# Patient Record
Sex: Male | Born: 1975 | Race: Black or African American | Hispanic: No | Marital: Single | Smoking: Never smoker
Health system: Southern US, Community
[De-identification: ages and names within clinical notes are randomized; demographics above are authoritative.]

## PROBLEM LIST (undated history)

## (undated) DIAGNOSIS — M419 Scoliosis, unspecified: Secondary | ICD-10-CM

## (undated) DIAGNOSIS — I10 Essential (primary) hypertension: Secondary | ICD-10-CM

---

## 2007-05-27 ENCOUNTER — Emergency Department: Payer: Self-pay | Admitting: Internal Medicine

## 2008-05-14 ENCOUNTER — Emergency Department: Payer: Self-pay | Admitting: Emergency Medicine

## 2008-05-21 ENCOUNTER — Emergency Department: Payer: Self-pay | Admitting: Unknown Physician Specialty

## 2008-06-15 ENCOUNTER — Ambulatory Visit: Payer: Self-pay | Admitting: Urology

## 2009-09-21 IMAGING — CR DG CHEST 2V
1 series · 2 of 2 positions shown · non-contrast
Comparison: none

REASON FOR EXAM: Coughing up blood
COMMENTS:

PROCEDURE:     DXR - DXR CHEST PA (OR AP) AND LATERAL  - May 21, 2008 [DATE]
RESULT:     There is a pronounced rotoscoliosis, concave to the left,
centered at approximately the T5 or T6 level.
The lungs appear to be clear. The heart and pulmonary vessels are normal.

[Series 1: view not recorded · 0.17mm/px · 2 of 2 slices shown]
[im 1/2]
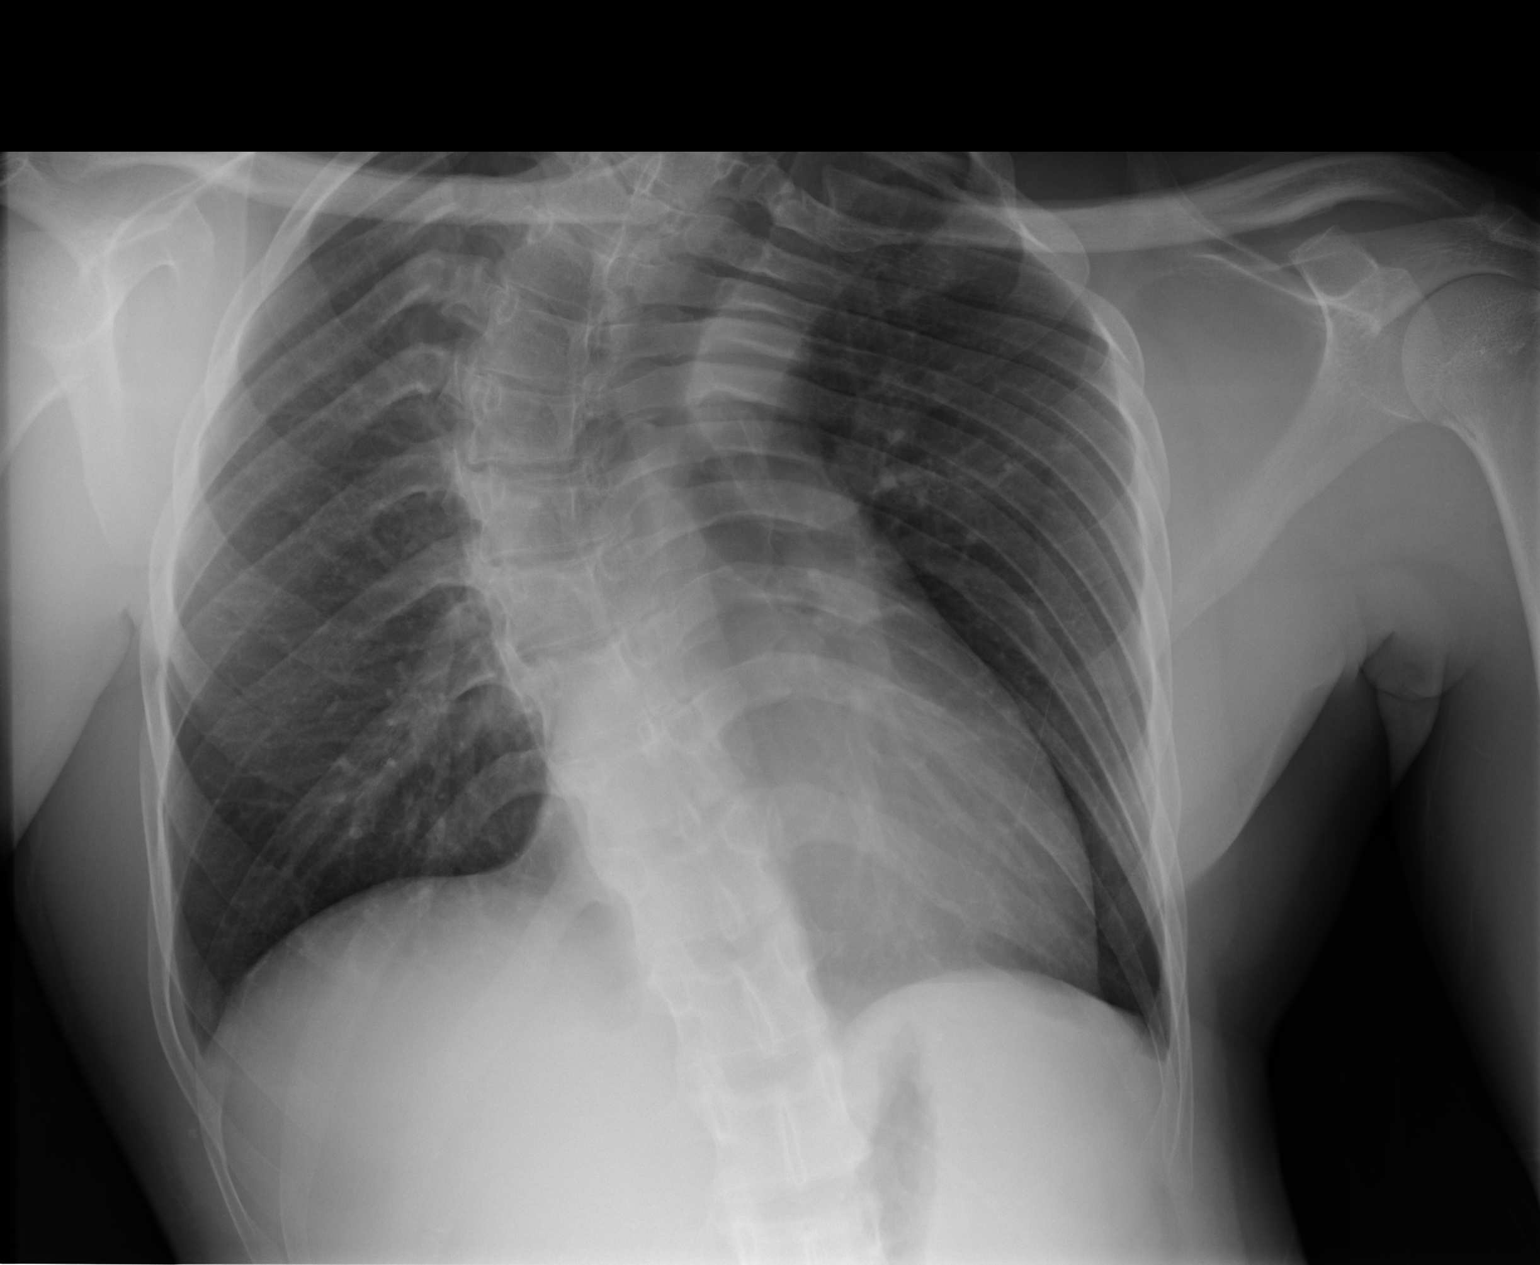
[im 2/2]
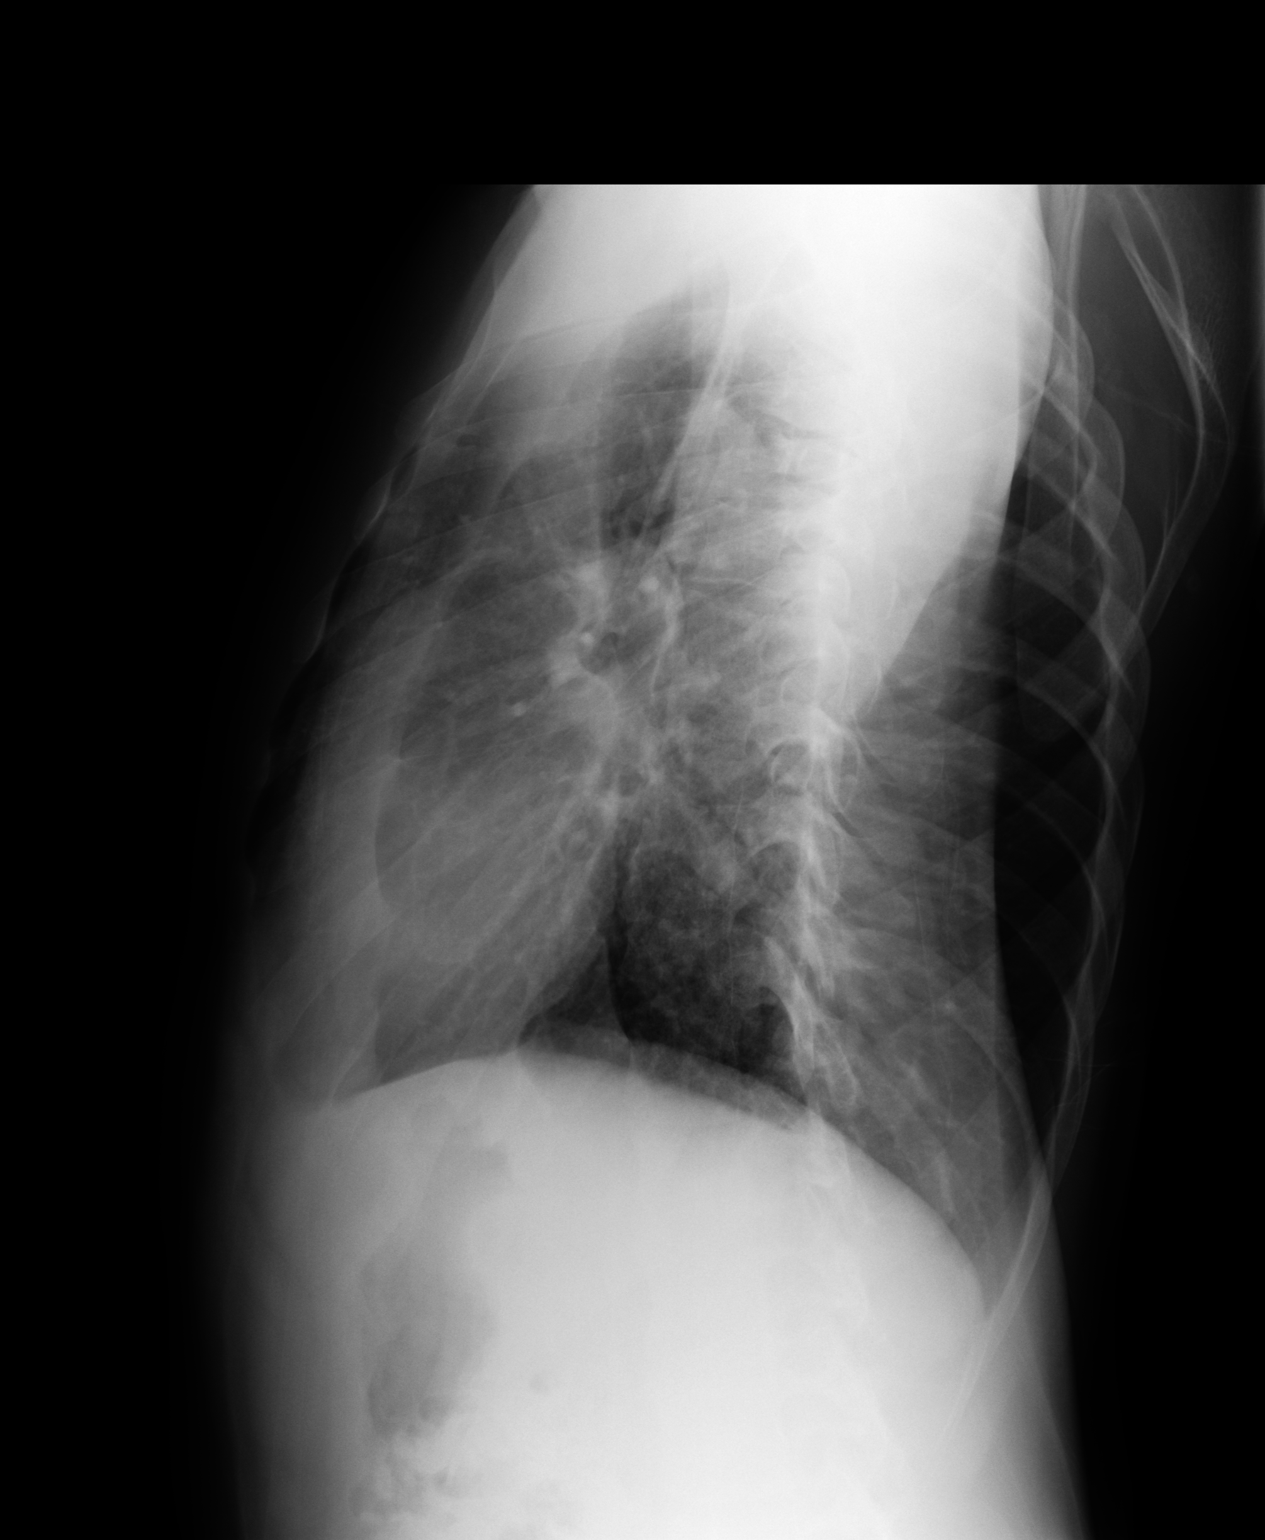

[2 of 2 positions shown; findings below may reference images not displayed]

IMPRESSION: 1.     No acute cardiopulmonary disease.
2.     Pronounced levoscoliosis.

## 2015-02-15 ENCOUNTER — Emergency Department
Admission: EM | Admit: 2015-02-15 | Discharge: 2015-02-15 | Disposition: A | Discharge status: Home / Self Care | Payer: BLUE CROSS/BLUE SHIELD | Attending: Emergency Medicine | Admitting: Emergency Medicine

## 2015-02-15 ENCOUNTER — Encounter: Payer: Self-pay | Admitting: Emergency Medicine

## 2015-02-15 ENCOUNTER — Emergency Department: Payer: BLUE CROSS/BLUE SHIELD

## 2015-02-15 DIAGNOSIS — Y998 Other external cause status: Secondary | ICD-10-CM | POA: Insufficient documentation

## 2015-02-15 DIAGNOSIS — R339 Retention of urine, unspecified: Secondary | ICD-10-CM | POA: Insufficient documentation

## 2015-02-15 DIAGNOSIS — S46912A Strain of unspecified muscle, fascia and tendon at shoulder and upper arm level, left arm, initial encounter: Secondary | ICD-10-CM | POA: Diagnosis not present

## 2015-02-15 DIAGNOSIS — Z8709 Personal history of other diseases of the respiratory system: Secondary | ICD-10-CM | POA: Diagnosis not present

## 2015-02-15 DIAGNOSIS — X58XXXA Exposure to other specified factors, initial encounter: Secondary | ICD-10-CM | POA: Diagnosis not present

## 2015-02-15 DIAGNOSIS — Y9289 Other specified places as the place of occurrence of the external cause: Secondary | ICD-10-CM | POA: Insufficient documentation

## 2015-02-15 DIAGNOSIS — Z79899 Other long term (current) drug therapy: Secondary | ICD-10-CM | POA: Insufficient documentation

## 2015-02-15 DIAGNOSIS — G8929 Other chronic pain: Secondary | ICD-10-CM | POA: Insufficient documentation

## 2015-02-15 DIAGNOSIS — Y9389 Activity, other specified: Secondary | ICD-10-CM | POA: Insufficient documentation

## 2015-02-15 DIAGNOSIS — S4992XA Unspecified injury of left shoulder and upper arm, initial encounter: Secondary | ICD-10-CM | POA: Diagnosis present

## 2015-02-15 DIAGNOSIS — I10 Essential (primary) hypertension: Secondary | ICD-10-CM | POA: Diagnosis not present

## 2015-02-15 DIAGNOSIS — R52 Pain, unspecified: Secondary | ICD-10-CM

## 2015-02-15 HISTORY — DX: Essential (primary) hypertension: I10

## 2015-02-15 HISTORY — DX: Scoliosis, unspecified: M41.9

## 2015-02-15 MED ORDER — CYCLOBENZAPRINE HCL 10 MG PO TABS: 10.0000 mg | ORAL_TABLET | Freq: Three times a day (TID) | ORAL | Status: AC | PRN

## 2015-02-15 MED ORDER — IBUPROFEN 800 MG PO TABS
800.0000 mg | ORAL_TABLET | Freq: Once | ORAL | Status: AC
  Administered 2015-02-15: 800 mg via ORAL
  Filled 2015-02-15: qty 1

## 2015-02-15 MED ORDER — IBUPROFEN 800 MG PO TABS: 800.0000 mg | ORAL_TABLET | Freq: Three times a day (TID) | ORAL | Status: DC | PRN

## 2015-02-15 MED ORDER — TRAMADOL HCL 50 MG PO TABS
50.0000 mg | ORAL_TABLET | Freq: Once | ORAL | Status: AC
  Administered 2015-02-15: 50 mg via ORAL
  Filled 2015-02-15: qty 1

## 2015-02-15 NOTE — ED Notes (Signed)
Pt to ED with chronic left shoulder pain, states he has had the pain since he was 39 years old but it has been getting worse

## 2015-02-15 NOTE — ED Notes (Signed)
Developed left shoulder pain after exercising yesterday   Denies any fall or trauma

## 2015-02-15 NOTE — ED Provider Notes (Signed)
[facility] Emergency Department Provider Note  ____________________________________________  Time seen: Approximately 4:38 PM  I have reviewed the triage vital signs and the nursing notes.   HISTORY  Chief Complaint Shoulder Pain    HPI Joshua [name] is a 39 y.o. male is complaining of left shoulder pain status post exercising yesterday. Same because he has chronic left shoulder pain since age28 years old and diagnosed with scoliosis.Patient is rating his pain as a 10 over 10. Patient states this pain is not relieve over-the-counter anti-inflammatory medication which normally helps.   Past Medical History  Diagnosis Date  . Scoliosis   . Hypertension     There are no active problems to display for this patient.   History reviewed. No pertinent past surgical history.  Current Outpatient Rx  Name  Route  Sig  Dispense  Refill  . metoprolol (LOPRESSOR) 50 MG tablet   Oral   Take 50 mg by mouth 2 (two) times daily.         . metoprolol (LOPRESSOR) 50 MG tablet   Oral   Take 50 mg by mouth daily.         . cyclobenzaprine (FLEXERIL) 10 MG tablet   Oral   Take 1 tablet (10 mg total) by mouth every 8 (eight) hours as needed for muscle spasms.   15 tablet   0   . ibuprofen (ADVIL,MOTRIN) 800 MG tablet   Oral   Take 1 tablet (800 mg total) by mouth every 8 (eight) hours as needed for moderate pain.   15 tablet   0     Allergies Review of patient's allergies indicates no known allergies.  No family history on file.  Social History Social History  Substance Use Topics  . Smoking status: Never Smoker   . Smokeless tobacco: None  . Alcohol Use: Yes    Review of Systems onstitutional: No fever/chills Eyes: No visual changes. ENT: No sore throat. Cardiovascular: Denies chest pain. Respiratory: Denies shortness of breath. Gastrointestinal: No abdominal pain.  No nausea, no vomiting.  No diarrhea.  No constipation. Genitourinary:  Negative for dysuria. Musculoskeletal: Scapular area (left) Skin: Negative for rash. Neurological: Negative for headaches, focal weakness or numbness. Endocrine: retention 10-point ROS otherwise negative.  ____________________________________________   PHYSICAL EXAM:  VITAL SIGNS: ED Triage Vitals  Enc Vitals Group     BP 02/15/15 1604 179/91 mmHg     Pulse Rate 02/15/15 1604 78     Resp --      Temp 02/15/15 1604 97.7 F (36.5 C)     Temp Source 02/15/15 1604 Oral     SpO2 02/15/15 1604 94 %     Weight 02/15/15 1604 145 lb (65.772 kg)     Height 02/15/15 1604  (1.753 m)     Head Cir --      Peak Flow --      Pain Score 02/15/15 1604 10     Pain Loc --      Pain Edu? --      Excl. in GC? --     Constitutional: Alert and oriented. Well appearing and in no acute distress. Eyes: Conjunctivae are normal. PERRL. EOMI. Head: Atraumatic. Nose: No congestion/rhinnorhea. Mouth/Throat: Mucous membranes are moist.  Oropharynx non-erythematous. Neck: No stridor.   Hematological/Lymphatic/Immunilogical: No cervical lymphadenopathy. Cardiovascular: Normal rate, regular rhythm. Grossly normal heart sounds.  Good peripheral circulation.elevate BP. Respiratory: Normal respiratory effort.  No retractions. Lungs CTAB. Gastrointestinal: Soft and nontender. No distention. No  abdominal bruits. No CVA tenderness. MusculoskeletalThoracic spinal deformities consistent history scoliosis. Winging of the left scapular area. urologic:  Normal speech and language. No gross focal neurologic deficits are appreciated. No gait instability. Skin:  Skin is warm, dry and intact. No rash noted. Psychiatric: Mood and affect are normal. Speech and behavior are normal.  ____________________________________________   LABS (all labs ordered are listed, but only abnormal results are displayed)  Labs Reviewed - No data to  display ____________________________________________  EKG   ____________________________________________  RADIOLOGY  No acute findings. I, Joni Reining, personally viewed and evaluated these images (plain radiographs) as part of my medical decision making.   ____________________________________________   PROCEDURES  Procedure(s) performed: None  Critical Care performed: No  ____________________________________________   INITIAL IMPRESSION / ASSESSMENT AND PLAN / ED COURSE  Pertinent labs & imaging results that were available during my care of the patient were reviewed by me and considered in my medical decision making (see chart for details).  Scapular muscle strain. Discussed x-ray findings with patients.____Patient advised to follow-up [facility]. Given a prescription for Flexeril and I for Profen. ________________________________________   FINAL CLINICAL IMPRESSION(S) / ED DIAGNOSES  Final diagnoses:  Pain aggravated by physical activity  Muscle strain of left scapular region, initial encounter      Joni Reining, PA-C 02/15/15 1721  Arnaldo Natal, MD 02/16/15 0010

## 2016-12-18 ENCOUNTER — Encounter: Payer: Self-pay | Admitting: Physical Therapy

## 2016-12-18 ENCOUNTER — Ambulatory Visit: Payer: BLUE CROSS/BLUE SHIELD | Attending: Physical Medicine and Rehabilitation | Admitting: Occupational Therapy

## 2016-12-18 ENCOUNTER — Ambulatory Visit: Payer: BLUE CROSS/BLUE SHIELD | Admitting: Physical Therapy

## 2016-12-18 ENCOUNTER — Encounter: Payer: Self-pay | Admitting: Occupational Therapy

## 2016-12-18 DIAGNOSIS — R278 Other lack of coordination: Secondary | ICD-10-CM | POA: Insufficient documentation

## 2016-12-18 DIAGNOSIS — M6281 Muscle weakness (generalized): Secondary | ICD-10-CM

## 2016-12-18 DIAGNOSIS — R2681 Unsteadiness on feet: Secondary | ICD-10-CM

## 2016-12-18 DIAGNOSIS — R262 Difficulty in walking, not elsewhere classified: Secondary | ICD-10-CM

## 2016-12-18 NOTE — Therapy (Addendum)
[facility] [address] [place], [place], [address] Phone: [phone]   Fax:  [phone]  Physical Therapy Evaluation  Patient Details  Name: Joshua HotterJames [name] MRN: [id number] Date of Birth: February 27, 1976 Referring Provider: Merrily BrittleAUCH, KIMBERLY [name]  Encounter Date: 12/18/2016      PT End of Session - 12/18/16 1450    Visit Number 1   Number of Visits 17   Date for PT Re-Evaluation 02/12/17   Authorization Type [employer]   PT Start Time 1430   PT Stop Time 1530   PT Time Calculation (min) 60 min   Equipment Utilized During Treatment Gait belt   Activity Tolerance Patient tolerated treatment well;Patient limited by pain   Behavior [facility] for tasks assessed/performed      Past Medical History:  Diagnosis Date  . Hypertension   . Scoliosis     History reviewed. No pertinent surgical history.  There were no vitals filed for this visit.       Subjective Assessment - 12/18/16 1434    Subjective Patient describes 5/10 pain at posterior of C6 with no radiating symptoms.  Patient denies headaches and dizziness. Patient claims sleeping throughout the whole night and is able to lie on side and on back.    Pertinent History Patient describes intermitten 3/10 pain in neck before scoliosis surgery. He reports that he was walking without  AD prior to surgery.  Patient had neck surgery in October of 2017, consisting of 13 screws in anterior neck, due to drastic change in balance and coordination.  Scoliosis surgery performed 11/27/16 with fusion of 13 vertebrae.  Patient denies dizziness, headaches, visual defecits, numbness, and tingling. Patient has 2 drains from abdomen.    Limitations Standing;Walking;Lifting   How long can you sit comfortably? 10 minutes   How long can you stand comfortably? 20 minutes   How long can you walk comfortably? 10 minutes   Patient Stated Goals Walk straight and be independent with ambulation    Currently in Pain? Yes   Pain Score 5    Pain Location Neck   Pain Orientation Posterior   Pain Descriptors / Indicators Sore   Pain Type Acute pain   Pain Radiating Towards N/A   Pain Onset More than a month ago   Pain Frequency Intermittent   Aggravating Factors  Standing   Pain Relieving Factors Ibuprofen   Effect of Pain on Daily Activities Unable to perform ADLs independently without difficulty   Multiple Pain Sites No            [facility] PT Assessment - 12/18/16 1443      Assessment   Medical Diagnosis spinal fusion   Referring [facility] Joshua [name]   Onset Date/Surgical Date 11/27/16   Hand Dominance Right   Prior Therapy Inpatient      Precautions   Precautions Back   Precaution Booklet Issued No   Precaution Comments no bending, lifting, twisting     Restrictions   Weight Bearing Restrictions No     Balance Screen   Has the patient fallen in the past 6 months Yes   How many times? once   Has the patient had a decrease in activity level because of a fear of falling?  Yes   Is the patient reluctant to leave their home because of a fear of falling?  Yes     Home Nurse, mental healthnvironment   Living Environment Private residence   Living Arrangements Parent  Available Help at Discharge Family;Friend([name])   Type of Home Mobile home   Home Access Stairs to enter   Entrance Stairs-Number of Steps 3   Entrance Stairs-Rails Can reach both   Home Layout One level   Home Equipment Walker - 2 wheels;Cane - single point     Prior Function   Level of Independence Independent   Vocation Full time employment   Vocation Requirements lifting, stocking, bending, being on feet during whole shift   Leisure write poems, basketball, video games     Cognition   Overall Cognitive Status Within Functional Limits for tasks assessed        PAIN: constant 3-5/10 intermittent 7/10. Increase in cervical pain with MMT of shoulder   POSTURE: Severe stiffness in upper thoracic and  cervical region of spine.   PROM/AROM: limited B shoulder abduction and flexion 120  degrees bilaterally  STRENGTH:  Graded on a 0-5 scale Muscle Group Left Right  Shoulder flex 3+/5 3+/5  Shoulder Abd 3+/5 3+/5  Shoulder Ext NT NT  Shoulder IR/ER NT NT  Elbow 5/5 5/5  Wrist/hand 5/5 5/5  Hip Flex 4/5 4/5  Hip Abd 4+/5 4+/5  Hip Add 4+/5 4+/5  Hip Ext NT NT  Hip IR/ER NT NT  Knee Flex 5/5 5/5  Knee Ext 5/5 5/5  Ankle DF 3/5 3/5  Ankle PF 4+/5 4+/5   SENSATION:  Intact bilaterally UE and LE   SPECIAL TESTS: negative SLR test   FUNCTIONAL MOBILITY: Independent with log roll. Able to pivot transfer with BUE assist.   BALANCE: Able to balance in narrow stance without walker for 1 minute EO; 5 seconds with EC.   GAIT:  2-wheel walker necessary for ambulation. Patient presents forward trunk lean, heavy assistance of BUE, narrow base of support, and poor control of ankle motion throughout gait cycle. Occasional shakiness in BLE.   OUTCOME MEASURES: TEST Outcome Interpretation      10 meter walk test          .47  m/[name] <1.0 m/[name] indicates increased risk for falls; limited community ambulator          [employer] Assessment 27/56 <36/56 (100% risk for falls), 37-45 (80% risk for falls); 46-51 (>50% risk for falls); 52-55 (lower risk <25% of falls)           Objective measurements completed on examination: See above findings.    Treatment/HEP:  Supine assisted HS stretch with strap 3 x 30 seconds Supine chin tucks with isometric shoulder extension/scap retractions 10 x 10 seconds.   Patient tolerated treatment session well today with no increase in pain.               PT Education - 12/18/16 1449    Education provided Yes   Education Details strengthen important upper thoracic/cervical postural muscles and stretch HS   Person([name]) Educated Patient;Parent([name])   Methods Explanation;Demonstration   Comprehension Verbalized understanding              PT Long Term Goals - 12/18/16 1615      PT LONG TERM GOAL #1   Title Increase BERG score by 7 points to show decreased falls risk with ADLs   Baseline 12/18/16 27/56   Time 8   Period Weeks   Status New     PT LONG TERM GOAL #2   Title Increase 10 MWT to 1.0 m/[name] to improve ability to safely ambulate in community   Baseline 12/18/16 .47 m/[name]  Time 8   Period Weeks     PT LONG TERM GOAL #3   Title Patient will be independent in home exercise program to improve strength/mobility for better functional independence with ADLs.   Baseline 01/05/17   Time 8   Period Weeks   Status New     PT LONG TERM GOAL #4   Title Patient will reduce timed up and go to <11 seconds to reduce fall risk and demonstrate improved transfer/gait ability.   Baseline 01/05/17 not tested due to safety   Time 8   Period Weeks   Status New                Plan - 2017-01-05 1556    Clinical Impression Statement Patient presents with referral for physical therapy from MD [name]/p cervical/thoracic spinal fusion surgery on 11/27/17.  Patient describes difficulty standing, walking, bending, lifting, and twisting due to pain in cervical and thoracic regions that is decreasing functional mobility and independence with ADLs.  Evaluation revealed impaired ankle and shoulder strength; ambulation with walker was guarded, slow  and CGA required for safety.  Patient was able to ambulate 200 feet today in clinic and out to car Patient will benefit from skilled physical therapy to improve functional mobility to increase independence with ADLs.   History and Personal Factors relevant to plan of care: This patient presents with 1 personal factors/ comorbidities; anxiety about injury and 4  body elements including body structures and functions, activity limitations and or participation restrictions; decrease ankle strength, poor balance, decreased coordination, and decreased endurance. Patient'[name] condition is stable   Clinical  Presentation Stable   Clinical Presentation due to: Surgery went well. Pain is already decreasing and patient is off of oxycodon.    Clinical Decision Making Moderate   Rehab Potential Good   Clinical Impairments Affecting Rehab Potential Poor balance, weakness in ankle joint, decreased endurance   PT Frequency 2x / week   PT Duration 8 weeks   PT Treatment/Interventions ADLs/Self Care Home Management;Aquatic Therapy;Biofeedback;Electrical Stimulation;Moist Heat;Traction;Contrast Bath;Gait training;Stair training;Functional mobility training;Therapeutic activities;Therapeutic exercise;Balance training;Neuromuscular re-education;Patient/family education;Manual techniques;Passive range of motion;Energy conservation;Splinting;Taping   PT Next Visit Plan Progress ROM exercises and begin stabilizing exercises   PT Home Exercise Plan HS stretch, chin tucks, and posterior upper quarter isometrics   Consulted and Agree with Plan of Care Patient      Patient will benefit from skilled therapeutic intervention in order to improve the following deficits and impairments:  Abnormal gait, Decreased coordination, Decreased range of motion, Difficulty walking, Decreased endurance, Decreased activity tolerance, Pain, Decreased balance, Hypomobility, Improper body mechanics, Decreased mobility, Decreased strength  Visit Diagnosis: Muscle weakness (generalized)  Difficulty in walking, not elsewhere classified  Unsteadiness on feet      G-Codes - 05-Jan-2017 1723    Functional Assessment Tool Used (Outpatient Only) Sharlene Motts ,10 MW   Functional Limitation Mobility: Walking and moving around   Mobility: Walking and Moving Around Current Status [phone]) At least 60 percent but less than 80 percent impaired, limited or restricted   Mobility: Walking and Moving Around Goal Status [phone]) At least 40 percent but less than 60 percent impaired, limited or restricted       Problem List There are no active problems to  display for this patient. This entire session was performed under direct supervision and direction of a licensed therapist/therapist assistant . I have personally read, edited and approve of the note as written. Joshua [name] SPT  Joshua [name], Joshua [name]  Joshua [name]  Joshua [name], PT, DPT 12/18/2016, 5:34 PM  [facility] MAIN [facility] SERVICES [address] [place], [place], [address] Phone: [phone]   Fax:  [phone]  Name: Joshua [name] MRN: [id number] Date of Birth: [birth year]

## 2016-12-20 NOTE — Therapy (Signed)
[place] [facility] MAIN [facility] SERVICES [address] [place], [place], [address] Phone: [phone]   Fax:  [phone]  Occupational Therapy Evaluation  Patient Details  Name: Joshua [name] MRN: [id number] Date of Birth: 04-Jun-1975 No Data Recorded  Encounter Date: 12/18/2016      OT End of Session - 12/20/16 2321    Visit Number 1   Number of Visits 24   Authorization Type BCBS   OT Start Time 1300   OT Stop Time 1355   OT Time Calculation (min) 55 min   Activity Tolerance Patient tolerated treatment well   Behavior During Therapy [facility] for tasks assessed/performed      Past Medical History:  Diagnosis Date  . Hypertension   . Scoliosis     History reviewed. No pertinent surgical history.  There were no vitals filed for this visit.      Subjective Assessment - 12/20/16 2320    Subjective  Patient reports he has scoliosis and just had recent surgery to his spine.  He reports he would like to get back to taking care of himself and possibly go back to work, likes to be around people.    Pertinent History Patient underwent spinal surgery on June 28 at [facility] and was admitted for 2.5 weeks including inpatient rehabilitation.  He did not receive home health and now presents for outpatient.     Patient Stated Goals Patient reports he would like to walk straighter, try to go back to work and also be able to take care of himself.    Currently in Pain? Yes   Pain Score 5    Pain Location Neck   Pain Orientation Posterior   Pain Descriptors / Indicators Sore   Pain Type Acute pain   Pain Onset More than a month ago   Pain Frequency Intermittent           [facility] OT Assessment - 12/20/16 2322      Assessment   Diagnosis spinal fusion, scoliosis   Onset Date 11/27/16     Precautions   Precautions Back   Precaution Booklet Issued No   Precaution Comments no bending, lifting, twisting     Restrictions   Weight Bearing Restrictions  No     Balance Screen   Has the patient fallen in the past 6 months No   Has the patient had a decrease in activity level because of a fear of falling?  No   Is the patient reluctant to leave their home because of a fear of falling?  No     Home  Environment   Family/patient expects to be discharged to: Private residence   Living Arrangements Parent   Available Help at Discharge Family   Type of Home Mobile home   Home Access Stairs   Home Layout One level   Alternate Level Stairs - Number of Steps 3-4 steps to enter with a handrails on both sides   Bathroom Shower/Tub Tub/Shower unit;Curtain   Shower/tub characteristics Curtain   Database administrator - 2 wheels;Bedside commode;Tub bench;Grab bars - tub/shower   Lives With Family     Prior Function   Level of Independence Independent with basic ADLs   Vocation Unemployed;Other (comment)  pursuing disability, 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     ADL   Eating/Feeding Modified independent   Grooming Modified independent   Upper Body Bathing Moderate assistance  Lower Body Bathing Moderate assistance   Upper Body Dressing Independent   Lower Body Dressing Minimal assistance   Toilet Transfer Modified independent   Toileting - Clothing Manipulation Increased time   Toileting -  Hygiene Modified Independent   Tub/Shower Transfer Supervision/safety   Equipment Used Reacher     IADL   Prior Level of Function Shopping independent   Shopping Completely unable to shop   Prior Level of Function Light Housekeeping independent    Light Housekeeping Does not participate in any housekeeping tasks;Needs help with all home maintenance tasks   Prior Level of Function Meal Prep mom always cooked, light meal prep with snack   Meal Prep Needs to have meals prepared and served   [employer] on family or friends for transportation   Medication Management Is responsible for taking  medication in correct dosages at correct time   Prior Level of Function Financial Management independent   Financial Management Requires assistance     Mobility   Mobility Status Needs assist   Mobility Status Comments used a walker around the home prior and a cane when going out. Now uses walker all the time.       Vision - History   Baseline Vision Wears glasses all the time     Cognition   Overall Cognitive Status Within Functional Limits for tasks assessed     Coordination   Fine Motor Movements are Fluid and Coordinated No   Coordination and Movement Description impaired on left   Finger Nose Finger Test impaired   9 Hole Peg Test Right;Left   Right 9 Hole Peg Test 43   Left 9 Hole Peg Test 59     ROM / Strength   AROM / PROM / Strength AROM;Strength     AROM   Overall AROM  Deficits   Overall AROM Comments Right shoulder flexion 115 degrees, left shoulder 110 degrees, all other motions WFLs     Strength   Overall Strength Deficits   Overall Strength Comments Right shoulder 4/5 overall, left 3/5 overall     Hand Function   Right Hand Grip (lbs) 38   Right Hand Lateral Pinch 12 lbs   Right Hand 3 Point Pinch 12 lbs   Left Hand Grip (lbs) 45   Left Hand Lateral Pinch 14 lbs   Left 3 point pinch 12 lbs         Instructed patient on ROM exercises for LUE for home for shoulder flexion, ABD, elbow flex/ext and reaching tasks. Patient able to demo understanding.  Recommended he participate in self care tasks more at home.                 OT Education - 12/20/16 2320    Education provided Yes   Education Details plan of care, exercises for home for UE ROM   Person(s) Educated Patient   Methods Demonstration;Explanation;Verbal cues   Comprehension Verbal cues required;Returned demonstration;Verbalized understanding             OT Long Term Goals - 12/20/16 2331      OT LONG TERM GOAL #1   Title Patient will improve B hand function with  coordination to be able to tie his shoes independently.    Baseline assist with tying shoes.    Time 12   Period Weeks   Status New     OT LONG TERM GOAL #2   Title Patient will demonstrate donning and doffing shoes with modified independence.  Baseline unable to complete   Time 12   Period Weeks   Status New     OT LONG TERM GOAL #3   Title Patient will complete bathing with modified independence.    Baseline increased assistance at eval   Time 12   Period Weeks   Status New     OT LONG TERM GOAL #4   Title Patient will be independent with home exercise program   Baseline no current program   Time 8   Period Weeks   Status New     OT LONG TERM GOAL #5   Title Patient will complete light homemaking tasks with modified independence.   Baseline unable   Time 12   Period Weeks   Status New     Long Term Additional Goals   Additional Long Term Goals Yes     OT LONG TERM GOAL #6   Title Patient will improve grip strength in left hand by 5# to open jars and containers with modified independence.    Time 12   Period Weeks   Status New               Plan - 12/20/16 2328    Clinical Impression Statement Patient is a 41 yo male who had surgery on 11/27/2016 for spinal fusion and has a history of scoliosis.  Patient lives with his parents, has not been able to work in the last 2 years, has not driven in the last 8 months.  Patient was previosly independent with basic self care,driving and was able to perform light housekeeping and meal prep.  He had a prior surgery on his neck 10/17.  He presents with muscle weakness, lack of coordination, decreased balance, decreased ability to perform self care tasks, IADL tasks and unable to work.  Patient would benefit from skilled OT to maximize his safety and independence in necessary daily tasks.  He would like to return to some type of work if possible.     Occupational Profile and client history currently impacting functional  performance scoliosis, prior surgeries, lives with parents, currently has a drain from back/neck   Occupational performance deficits (Please refer to evaluation for details): ADL's;IADL's;Rest and Sleep;Work;Social Participation   Rehab Potential Good   OT Frequency 2x / week   OT Duration 12 weeks   OT Treatment/Interventions Self-care/ADL training;Moist Heat;DME and/or AE instruction;Patient/family education;Therapeutic exercises;Balance training;Therapeutic exercise;Therapeutic activities;Neuromuscular education;Functional Mobility Training;Passive range of motion;Manual Therapy   Clinical Decision Making Several treatment options, min-mod task modification necessary   Consulted and Agree with Plan of Care Patient      Patient will benefit from skilled therapeutic intervention in order to improve the following deficits and impairments:  Decreased coordination, Decreased range of motion, Difficulty walking, Decreased endurance, Decreased activity tolerance, Decreased knowledge of precautions, Decreased balance, Decreased knowledge of use of DME, Impaired UE functional use, Pain, Decreased strength  Visit Diagnosis: Muscle weakness (generalized)  Other lack of coordination    Problem List There are no active problems to display for this patient.  Kerrie Buffalomy T Keianna Signer, OTR/L, CLT  Jariya Reichow 12/20/2016, 11:41 PM  [place] [facility] [address] [place], [place], [address] Phone: [phone]   Fax:  [phone]  Name: Joshua [name] MRN: [id number] Date of Birth: 09/22/1975

## 2016-12-23 ENCOUNTER — Ambulatory Visit: Payer: BLUE CROSS/BLUE SHIELD | Admitting: Occupational Therapy

## 2016-12-23 ENCOUNTER — Ambulatory Visit: Payer: BLUE CROSS/BLUE SHIELD | Admitting: Physical Therapy

## 2016-12-25 ENCOUNTER — Encounter: Payer: Self-pay | Admitting: Physical Therapy

## 2016-12-25 ENCOUNTER — Ambulatory Visit: Payer: BLUE CROSS/BLUE SHIELD | Admitting: Physical Therapy

## 2016-12-25 ENCOUNTER — Ambulatory Visit: Payer: BLUE CROSS/BLUE SHIELD | Admitting: Occupational Therapy

## 2016-12-25 ENCOUNTER — Encounter: Payer: Self-pay | Admitting: Occupational Therapy

## 2016-12-25 DIAGNOSIS — R2681 Unsteadiness on feet: Secondary | ICD-10-CM

## 2016-12-25 DIAGNOSIS — R262 Difficulty in walking, not elsewhere classified: Secondary | ICD-10-CM

## 2016-12-25 DIAGNOSIS — R278 Other lack of coordination: Secondary | ICD-10-CM

## 2016-12-25 DIAGNOSIS — M6281 Muscle weakness (generalized): Secondary | ICD-10-CM | POA: Diagnosis not present

## 2016-12-25 NOTE — Therapy (Signed)
[facility] [facility] [address] [place], [place], [address] Phone: [phone]   Fax:  [phone]  Physical Therapy Treatment  Patient Details  Name: Joshua [name] MRN: [id number] Date of Birth: March 14, 1976 Referring Provider: Merrily BrittleAUCH, KIMBERLY KARRAT  Encounter Date: 12/25/2016      PT End of Session - 12/25/16 1052    Visit Number 2   Number of Visits 17   Date for PT Re-Evaluation 02/12/17   Authorization Type BCBS   PT Start Time 0900   PT Stop Time 0945   PT Time Calculation (min) 45 min   Equipment Utilized During Treatment Back brace   Activity Tolerance Patient tolerated treatment well;No increased pain   Behavior During Therapy WFL for tasks assessed/performed      Past Medical History:  Diagnosis Date  . Hypertension   . Scoliosis     History reviewed. No pertinent surgical history.  There were no vitals filed for this visit.      Subjective Assessment - 12/25/16 0901    Subjective Pt reported getting drains taken out yesterday and presents to therapy with just the brace/bandage around waist; Pt denies any neck or back pin; Pt states he goes tk follow up with doctor next tuesday;    Patient is accompained by: Family member   Pertinent History Patient describes intermitten 3/10 pain in neck before scoliosis surgery. He reports that he was walking without  AD prior to surgery.  Patient had neck surgery in October of 2017, consisting of 13 screws in anterior neck, due to drastic change in balance and coordination.  Scoliosis surgery performed 11/27/16 with fusion of 13 vertebrae.  Patient denies dizziness, headaches, visual defecits, numbness, and tingling. Patient has 2 drains from abdomen.    Limitations Standing;Walking;Lifting   How long can you sit comfortably? 10 minutes   How long can you stand comfortably? 20 minutes   How long can you walk comfortably? 10 minutes   Patient Stated Goals Walk straight and be  independent with ambulation   Currently in Pain? No/denies   Pain Onset More than a month ago      PT TREATMENT;  Supine;  Passive hamstring stretch 3 x 30sec  Pt instructed in Piriformis stretch 3 x 30 sec   Hooklying;  posterior pelvic tilt and abdominal bracing 10 reps x 5 sec hold;  90/90 and alt leg extension with good abdominal control 2 x 10 ea  Pt required min cues for correct positioning during exercise; Pt also cued to continue breath support throughout exercise; Pt showed some compensation recruiting neck musculature;      Seated;  Alt dorsiflexion with red tband 2 x 10 ea; to increase dorsiflexion strength;   Seated on Physioball; x 2 PT assist for safety sitting on ball during exercises; Shoulders flexed to 90 degrees on table and applying downward force for abdominal bracing 2 x 10  Bilat rows with red tband 2 x 15; Pt required verbal cues to focus on scapular retraction and for arm positioning during exercise; Shoulder scaption with  Yellow tband x 15 ea; Pt was cued to do exercise through full ROM and incorporate the scapular retraction at end of range; Eliminated resistance x 15 ea to insure the pt had correct technique; Pt then x 10 ea with 1lb dumbbell in with good form and no trunk lean; Alt Knee extension; Pt cued to decrease backwards lean; 2 x 10 ea; Pt cued throughout exercises on pball to  weight shift in order to find center on ball;  Pt tolerated treatment well and no increase in pain;                             PT Education - 12/25/16 1051    Education provided Yes   Education Details HEP exercises/ stretching; core strengthening and stabilizing exercises   Person(s) Educated Patient   Methods Explanation;Demonstration;Verbal cues;Tactile cues   Comprehension Verbalized understanding;Returned demonstration;Verbal cues required             PT Long Term Goals - 12/18/16 1615      PT LONG TERM GOAL #1   Title Increase BERG  score by 7 points to show decreased falls risk with ADLs   Baseline 12/18/16 27/56   Time 8   Period Weeks   Status New     PT LONG TERM GOAL #2   Title Increase 10 MWT to 1.0 m/s to improve ability to safely ambulate in community   Baseline 12/18/16 .47 m/s   Time 8   Period Weeks     PT LONG TERM GOAL #3   Title Patient will be independent in home exercise program to improve strength/mobility for better functional independence with ADLs.   Baseline 12/18/16   Time 8   Period Weeks   Status New     PT LONG TERM GOAL #4   Title Patient will reduce timed up and go to <11 seconds to reduce fall risk and demonstrate improved transfer/gait ability.   Baseline 12/18/16 not tested due to safety   Time 8   Period Weeks   Status New               Plan - 12/25/16 1052    Clinical Impression Statement Pt was instructed in passive LE stretching and core strengthening exercises; Pt was taught abdominal bracing and incorportating it throughout exercises today; Pt used his own tactile cues for practicing posterior pelvic tilts while bracing abdominals; Pt was able to progress exercise to stabilizing abdominals while incorporating UE movement; Pt was also challenged by having him seated on pball in order to activate abdominals while doing some dynamic movements; Pt required cues to monitor breathing and decrease compensations; Pt would continue to beneift from continued skilled PT in order to stabilize and strengthen muscles; improve balance and gait safety;   Rehab Potential Good   Clinical Impairments Affecting Rehab Potential Poor balance, weakness in ankle joint, decreased endurance   PT Frequency 2x / week   PT Duration 8 weeks   PT Treatment/Interventions ADLs/Self Care Home Management;Aquatic Therapy;Biofeedback;Electrical Stimulation;Moist Heat;Traction;Contrast Bath;Gait training;Stair training;Functional mobility training;Therapeutic activities;Therapeutic exercise;Balance  training;Neuromuscular re-education;Patient/family education;Manual techniques;Passive range of motion;Energy conservation;Splinting;Taping   PT Next Visit Plan Progress ROM exercises and begin stabilizing exercises   PT Home Exercise Plan HS stretch, chin tucks, and posterior upper quarter isometrics   Consulted and Agree with Plan of Care Patient      Patient will benefit from skilled therapeutic intervention in order to improve the following deficits and impairments:  Abnormal gait, Decreased coordination, Decreased range of motion, Difficulty walking, Decreased endurance, Decreased activity tolerance, Pain, Decreased balance, Hypomobility, Improper body mechanics, Decreased mobility, Decreased strength  Visit Diagnosis: Muscle weakness (generalized)  Difficulty in walking, not elsewhere classified  Unsteadiness on feet  Other lack of coordination     Problem List There are no active problems to display for this patient.  [address] [place], [place]  This entire session was performed under direct supervision and direction of a licensed therapist/therapist assistant . I have personally read, edited and approve of the note as written.    Trotter,Margaret PT, DPT 12/25/2016, 12:08 PM  [place] [facility] [address] [place], [place], [address] Phone: [phone]   Fax:  [phone]  Name: Joshua [name] MRN: [id number] Date of Birth: 29-Sep-1975

## 2016-12-25 NOTE — Therapy (Signed)
[place] [facility] [address] [place], [place], [address] Phone: [phone]   Fax:  [phone]  Occupational Therapy Treatment  Patient Details  Name: Joshua [name] MRN: [id number] Date of Birth: 01/04/1976 No Data Recorded  Encounter Date: 12/25/2016      OT End of Session - 12/25/16 1615    Visit Number 2   Number of Visits 24   Authorization Type BCBS   OT Start Time 0945   OT Stop Time 1030   OT Time Calculation (min) 45 min   Activity Tolerance Patient tolerated treatment well   Behavior During Therapy [facility] for tasks assessed/performed      Past Medical History:  Diagnosis Date  . Hypertension   . Scoliosis     History reviewed. No pertinent surgical history.  There were no vitals filed for this visit.      Subjective Assessment - 12/25/16 0950    Subjective  Patient reports his hands were tight this morning but has loosened up since.  No complaints of pain.  Got his drain out on Tuesday of this week.  Now wearing an abdominal binder for 3 weeks all the time.     Patient Stated Goals Patient reports he would like to walk straighter, try to go back to work and also be able to take care of himself.    Currently in Pain? No/denies   Pain Score 0-No pain                      OT Treatments/Exercises (OP) - 12/25/16 [id number]      Fine Motor Coordination   Other Fine Motor Exercises Patient seen for fine motor coordination exercises with left hand with manipulation of nuts and bolts with cues for prehension patterns and thumb finger combinations.  Patient stabilizing objects with right hand while left hand engaged in manipulation tasks. Patient also working on flipping and turning objects from one end to the other with Darel HongJudy board with use of left hand and cues for manipulation skills for proper form and technique.       Neurological Re-education Exercises   Other Exercises 1 Patient seen for BUE ROM and  strengthening with 1# dowel for chest press, forward circles, backwards circles for 2 sets of 10 repetitions. Left hand sustained grip with 11# for 25 reps for 2 sets, was able to perform 17# grip for 5 repetitions however dropping items frequently.                  OT Education - 12/25/16 1615    Education provided Yes   Education Details fine motor coordination exercises for home.   Person(s) Educated Patient   Methods Explanation;Demonstration;Verbal cues   Comprehension Verbal cues required;Returned demonstration;Verbalized understanding             OT Long Term Goals - 12/20/16 2331      OT LONG TERM GOAL #1   Title Patient will improve B hand function with coordination to be able to tie his shoes independently.    Baseline assist with tying shoes.    Time 12   Period Weeks   Status New     OT LONG TERM GOAL #2   Title Patient will demonstrate donning and doffing shoes with modified independence.    Baseline unable to complete   Time 12   Period Weeks   Status New     OT LONG TERM GOAL #3  Title Patient will complete bathing with modified independence.    Baseline increased assistance at eval   Time 12   Period Weeks   Status New     OT LONG TERM GOAL #4   Title Patient will be independent with home exercise program   Baseline no current program   Time 8   Period Weeks   Status New     OT LONG TERM GOAL #5   Title Patient will complete light homemaking tasks with modified independence.   Baseline unable   Time 12   Period Weeks   Status New     Long Term Additional Goals   Additional Long Term Goals Yes     OT LONG TERM GOAL #6   Title Patient will improve grip strength in left hand by 5# to open jars and containers with modified independence.    Time 12   Period Weeks   Status New               Plan - 12/25/16 1615    Clinical Impression Statement Patient demonstrates decreased grip, pinch and coordination of left UE which is  impacting his performance in self care tasks at home.  He was able to participate in sustained grip with 11# however next setting of 17# was too challenging.  Patient requires cues for proper form and technique with thumb finger combinations as well as manipulation skills with left hand.  Continue to work towards goals to increase independence in daily tasks.    Rehab Potential Good   OT Frequency 2x / week   OT Duration 12 weeks   Consulted and Agree with Plan of Care Patient      Patient will benefit from skilled therapeutic intervention in order to improve the following deficits and impairments:  Decreased coordination, Decreased range of motion, Difficulty walking, Decreased endurance, Decreased activity tolerance, Decreased knowledge of precautions, Decreased balance, Decreased knowledge of use of DME, Impaired UE functional use, Pain, Decreased strength  Visit Diagnosis: Muscle weakness (generalized)  Other lack of coordination    Problem List There are no active problems to display for this patient.  Kerrie Buffalomy T Axcel Horsch, OTR/L, CLT  [place] 12/25/2016, 4:18 PM  [facility] [address] [place], [place], [address] Phone: [phone]   Fax:  [phone]  Name: Joshua [name] MRN: [id number] Date of Birth: 1975/06/18

## 2016-12-30 ENCOUNTER — Ambulatory Visit: Payer: BLUE CROSS/BLUE SHIELD | Admitting: Physical Therapy

## 2016-12-30 ENCOUNTER — Encounter: Payer: Self-pay | Admitting: Physical Therapy

## 2016-12-30 ENCOUNTER — Ambulatory Visit: Payer: BLUE CROSS/BLUE SHIELD | Admitting: Occupational Therapy

## 2016-12-30 DIAGNOSIS — R262 Difficulty in walking, not elsewhere classified: Secondary | ICD-10-CM

## 2016-12-30 DIAGNOSIS — M6281 Muscle weakness (generalized): Secondary | ICD-10-CM

## 2016-12-30 DIAGNOSIS — R278 Other lack of coordination: Secondary | ICD-10-CM

## 2016-12-30 NOTE — Therapy (Signed)
[facility] [address] [place], [place], [address] Phone: [phone]   Fax:  [phone]  Physical Therapy Treatment  Patient Details  Name: Joshua HotterJames [name] MRN: [id number] Date of Birth: 12/07/1975 Referring Provider: Merrily BrittleAUCH, Joshua [name]  Encounter Date: 12/30/2016      PT End of Session - 12/30/16 1539    Visit Number 3   Number of Visits 17   Date for PT Re-Evaluation 02/12/17   Authorization Type BCBS   PT Start Time 1515   PT Stop Time 1600   PT Time Calculation (min) 45 min   Equipment Utilized During Treatment Gait belt   Activity Tolerance Patient tolerated treatment well;No increased pain   Behavior During Therapy WFL for tasks assessed/performed      Past Medical History:  Diagnosis Date  . Hypertension   . Scoliosis     History reviewed. No pertinent surgical history.  There were no vitals filed for this visit.      Subjective Assessment - 12/30/16 1535    Subjective Patient reports not having pain today. [facility] [place] [facility] [place] [facility]; surgery seems to have gone well.    Pertinent History Patient describes intermitten 3/10 pain in neck before scoliosis surgery. He reports that he was walking without  AD prior to surgery.  Patient had neck surgery in October of 2017, consisting of 13 screws in anterior neck, due to drastic change in balance and coordination.  Scoliosis surgery performed 11/27/16 with fusion of 13 vertebrae.  Patient denies dizziness, headaches, visual defecits, numbness, and tingling. Patient has 2 drains from abdomen.    Limitations Standing;Walking;Lifting   How long can you sit comfortably? 10 minutes   How long can you stand comfortably? 20 minutes   How long can you walk comfortably? 10 minutes   Patient Stated Goals Walk straight and be independent with ambulation   Currently in Pain? No/denies   Pain Score 0-No pain   Multiple Pain Sites  No       Treatment :  Contract-relax HS stretch 3 x 30 seconds each   Hooklying LAQ 10 x 5 second holds Seated LAQ 20 x 3 second holds  Seated row on stool at tower 12.5 lbs 20x. Patient showed good control on stool and did not lose balance. Patient shows good ability to keep trunk stable after minimal cueing. Seated high row on stool at tower 12.5 lbs 2 x 20. Patient feels good muscular demand in lats and rear deltoids.  Seated scaption on stool 2 x 10. After moderate ceing, patient is able to keep scapulae depressed and displays minimal upper trap activation  Seated hip marches with 5 lbs weight on stool 2 x 20. Patient is able to display good trunk control here.    No pain with today's session.                       PT Education - 12/30/16 1539    Education Details Continue challenging stability with movement   Person(s) Educated Patient   Methods Explanation   Comprehension Verbalized understanding             PT Long Term Goals - 12/18/16 1615      PT LONG TERM GOAL #1   Title Increase BERG score by 7 points to show decreased falls risk with ADLs   Baseline 12/18/16 27/56   Time 8   Period Weeks  Status New     PT LONG TERM GOAL #2   Title Increase 10 MWT to 1.0 m/s to improve ability to safely ambulate in community   Baseline 12/18/16 .47 m/s   Time 8   Period Weeks     PT LONG TERM GOAL #3   Title Patient will be independent in home exercise program to improve strength/mobility for better functional independence with ADLs.   Baseline 12/18/16   Time 8   Period Weeks   Status New     PT LONG TERM GOAL #4   Title Patient will reduce timed up and go to <11 seconds to reduce fall risk and demonstrate improved transfer/gait ability.   Baseline 12/18/16 not tested due to safety   Time 8   Period Weeks   Status New               Plan - 12/30/16 1602    Clinical Impression Statement Patient displayed increased tone in bilateral  HS that mildly decreased with stretching.  Patient demonstrated good carry-over of correct technique from last visit and did not need extensive cueing for proper scapular positioning and abdominal bracing.  Patient did not experience any pain throughout strengthening therex and was able to ambulate independently with his 2-wheel walker around the gym.  Patient will continue to benefit from skilled physical therapy to continue improving core stability and increase independence.    Rehab Potential Good   Clinical Impairments Affecting Rehab Potential Poor balance, weakness in ankle joint, decreased endurance   PT Frequency 2x / week   PT Duration 8 weeks   PT Treatment/Interventions ADLs/Self Care Home Management;Aquatic Therapy;Biofeedback;Electrical Stimulation;Moist Heat;Traction;Contrast Bath;Gait training;Stair training;Functional mobility training;Therapeutic activities;Therapeutic exercise;Balance training;Neuromuscular re-education;Patient/family education;Manual techniques;Passive range of motion;Energy conservation;Splinting;Taping   PT Next Visit Plan Continue strength therex and begin trunk rotation ROM   PT Home Exercise Plan Chin tucks and HS stretch   Consulted and Agree with Plan of Care Patient      Patient will benefit from skilled therapeutic intervention in order to improve the following deficits and impairments:  Abnormal gait, Decreased coordination, Decreased range of motion, Difficulty walking, Decreased endurance, Decreased activity tolerance, Pain, Decreased balance, Hypomobility, Improper body mechanics, Decreased mobility, Decreased strength  Visit Diagnosis: Muscle weakness (generalized)  Difficulty in walking, not elsewhere classified     Problem List There are no active problems to display for this patient. This entire session was performed under direct supervision and direction of a licensed therapist/therapist assistant . I have personally read, edited and  approve of the note as written. [facility]Douglas Gaither Biehn [address]PT  [place], BarreKristine S, [place] DPT 12/30/2016, 5:15 PM  [facility] [address] [place], [place], [address] Phone: [phone]   Fax:  [phone]  Name: Joshua HotterJames [name] MRN: [id number] Date of Birth: 1975-08-23

## 2016-12-30 NOTE — Therapy (Signed)
[facility] [address] [place], [place], [address] Phone: [phone]   Fax:  [phone]  Occupational Therapy Treatment  Patient Details  Name: Joshua [name] MRN: [id number] Date of Birth: May 14, 1976 No Data Recorded  Encounter Date: 12/30/2016      OT End of Session - 12/30/16 1611    Visit Number 3   Number of Visits 24   OT Start Time 1600   OT Stop Time 1645   OT Time Calculation (min) 45 min   Activity Tolerance Patient tolerated treatment well   Behavior During Therapy [facility] for tasks assessed/performed      Past Medical History:  Diagnosis Date  . Hypertension   . Scoliosis     No past surgical history on file.  There were no vitals filed for this visit.      Subjective Assessment - 12/30/16 1609    Subjective  Pt. reports he is having less pain than last week.    Pertinent History Patient [facility] and was admitted for 2.5 weeks including inpatient rehabilitation.  He did not receive home health and now presents for outpatient.     Currently in Pain? No/denies      OT TREATMENT    Neuro muscular re-education:  Pt. performed [facility] tasks using the Grooved pegboard. Pt. worked on grasping the grooved pegs from a horizontal position, and moving the pegs to a vertical position in the hand to prepare for placing them in the grooved slot.   Therapeutic Exercise:  Pt. performed 1.5# dowel ex. For UE strengthening secondary to weakness. chest press, small circular patterns, and elbow flexion/extension were performed. 2# dumbbell ex. for elbow flexion, forearm supination/pronation, wrist flexion/extension, and radial deviation. Pt. requires rest breaks and verbal cues for proper technique. Pt. performed gross gripping with grip strengthener. Pt. worked on sustaining grip while grasping pegs and reaching at various heights. Gripper was placed in the 3rd resistive slot  with the white resistive spring.Pt. Worked on pinch strengthening in the left hand for lateral, and 3pt. pinch using red, green, and blue resistive clips. Pt. worked on placing the clips at various vertical and horizontal angles. Tactile and verbal cues were required for eliciting the desired movement.                             OT Education - 12/30/16 1609    Education provided Yes   Education Details UE ther. ex., [facility].   Person(s) Educated Patient   Methods Explanation   Comprehension Verbalized understanding             OT Long Term Goals - 12/20/16 2331      OT LONG TERM GOAL #1   Title Patient will improve B hand function with coordination to be able to tie his shoes independently.    Baseline assist with tying shoes.    Time 12   Period Weeks   Status New     OT LONG TERM GOAL #2   Title Patient will demonstrate donning and doffing shoes with modified independence.    Baseline unable to complete   Time 12   Period Weeks   Status New     OT LONG TERM GOAL #3   Title Patient will complete bathing with modified independence.    Baseline increased assistance at eval   Time 12   Period Weeks  Status New     OT LONG TERM GOAL #4   Title Patient will be independent with home exercise program   Baseline no current program   Time 8   Period Weeks   Status New     OT LONG TERM GOAL #5   Title Patient will complete light homemaking tasks with modified independence.   Baseline unable   Time 12   Period Weeks   Status New     Long Term Additional Goals   Additional Long Term Goals Yes     OT LONG TERM GOAL #6   Title Patient will improve grip strength in left hand by 5# to open jars and containers with modified independence.    Time 12   Period Weeks   Status New               Plan - 12/30/16 1641    Clinical Impression Statement Pt. continues to present with limited LUE strength, decreased pinch, grip, and coordination  skills which limit his ability to complete daily ADL tasks. Pt. continues to work on improving LUE strength, and  coordination skills in order to improve engagement in ADLs, and IADLs.   Occupational performance deficits (Please refer to evaluation for details): ADL's;IADL's   Rehab Potential Good   OT Frequency 2x / week   OT Duration 12 weeks   OT Treatment/Interventions Self-care/ADL training;Moist Heat;DME and/or AE instruction;Patient/family education;Therapeutic exercises;Balance training;Therapeutic exercise;Therapeutic activities;Neuromuscular education;Functional Mobility Training;Passive range of motion;Manual Therapy      Patient will benefit from skilled therapeutic intervention in order to improve the following deficits and impairments:  Decreased coordination, Decreased range of motion, Difficulty walking, Decreased endurance, Decreased activity tolerance, Decreased knowledge of precautions, Decreased balance, Decreased knowledge of use of DME, Impaired UE functional use, Pain, Decreased strength  Visit Diagnosis: No diagnosis found.    Problem List There are no active problems to display for this patient.   Olegario MessierElaine Lunetta Marina, MS, OTR/L 12/30/2016, 5:10 PM  [facility] [address] [place], [place], [address] Phone: [phone]   Fax:  [phone]  Name: Joshua [name] MRN: [id number] Date of Birth: February 10, 1976

## 2017-01-01 ENCOUNTER — Ambulatory Visit: Payer: BLUE CROSS/BLUE SHIELD | Attending: Physical Medicine and Rehabilitation | Admitting: Physical Therapy

## 2017-01-01 ENCOUNTER — Ambulatory Visit: Payer: BLUE CROSS/BLUE SHIELD | Admitting: Occupational Therapy

## 2017-01-01 ENCOUNTER — Encounter: Payer: Self-pay | Admitting: Physical Therapy

## 2017-01-01 DIAGNOSIS — R278 Other lack of coordination: Secondary | ICD-10-CM | POA: Insufficient documentation

## 2017-01-01 DIAGNOSIS — R262 Difficulty in walking, not elsewhere classified: Secondary | ICD-10-CM | POA: Diagnosis present

## 2017-01-01 DIAGNOSIS — M6281 Muscle weakness (generalized): Secondary | ICD-10-CM

## 2017-01-01 DIAGNOSIS — R2681 Unsteadiness on feet: Secondary | ICD-10-CM | POA: Diagnosis present

## 2017-01-01 NOTE — Therapy (Signed)
[facility] MAIN [facility] SERVICES [address] [place], [place], [address] Phone: [phone]   Fax:  [phone]  Physical Therapy Treatment  Patient Details  Name: Joshua [name] MRN: [id number] Date of Birth: [birth year] Referring Provider: Merrily Brittle  Encounter Date: 01/01/2017      PT End of Session - 01/01/17 1511    Visit Number 4   Number of Visits 17   Date for PT Re-Evaluation 02/12/17   Authorization Type BCBS   PT Start Time 1510   PT Stop Time 1555   PT Time Calculation (min) 45 min   Equipment Utilized During Treatment Gait belt   Activity Tolerance Patient tolerated treatment well;No increased pain   Behavior During Therapy WFL for tasks assessed/performed      Past Medical History:  Diagnosis Date  . Hypertension   . Scoliosis     History reviewed. No pertinent surgical history.  There were no vitals filed for this visit.      Subjective Assessment - 01/01/17 1509    Subjective Patient has no pain or soreness to report today.   Pertinent History Patient describes intermitten 3/10 pain in neck before scoliosis surgery. He reports that he was walking without  AD prior to surgery.  Patient had neck surgery in October of 2017, consisting of 13 screws in anterior neck, due to drastic change in balance and coordination.  Scoliosis surgery performed 11/27/16 with fusion of 13 vertebrae.  Patient denies dizziness, headaches, visual defecits, numbness, and tingling. Patient has 2 drains from abdomen.    Limitations Standing;Walking;Lifting   How long can you sit comfortably? 10 minutes   How long can you stand comfortably? 20 minutes   How long can you walk comfortably? 10 minutes   Patient Stated Goals Walk straight and be independent with ambulation   Currently in Pain? No/denies   Pain Score 0-No pain   Multiple Pain Sites No         Treatment:  Nu Step warm up 5 minutes. This is patient's first time on  machine; no pain or discomfort.  Seated rows on stool 3 x 10 17.5 lbs. Moderate cueing for upright posture and depressing of shoulders Seated lat pull downs on stool 3 x 10 17.5 lbs. Patient cued to engage lats and scapular retractors without upper trap recruitment.  Seated scaption on stool 3 x 10 1 lb dumbbell. Patient shows good scap stabilizing ability  Seated shoulder ER on stool 10 x 5 seconds each 2.5 lbs. Patient shows good fatigue here and responds well to cueing for correct posture and scapular control.  Dorsiflexion seated 3 lbs on forefoot 20 x 5 seconds.  Patient unable to get dorsiflexion when feet are placed directly underneath knees; better ROM with knees extended.  LAQ 20 x 5 seconds with 3 lbs. Patient gets good quad fatigue here. Occasional shaking of R leg with increased demand that resolves with rest.  TA isometrics with swiss ball 10 x 10 seconds. Minimal cueing necessary for abdominal bracing.  HS stretch with strap 3 x 30 seconds. Mild decrease in tone and improvement in ROM towards end of repetitions Piriformis stretch 3 x 30 seconds. Patient instructed to use hip flexors and abdominals to increase intensity of stretch.   Patient tolerated therapy well today and did not require rest breaks.   HEP given to continue stretching HS, piriformis, chin tucks, and shoulder ER with theraband.  PT Education - 01/01/17 1510    Education provided Yes   Education Details Patient POC to improve strength and ROM while respecting precautions from surgery   Person(s) Educated Patient   Methods Explanation   Comprehension Verbalized understanding             PT Long Term Goals - 12/18/16 1615      PT LONG TERM GOAL #1   Title Increase BERG score by 7 points to show decreased falls risk with ADLs   Baseline 12/18/16 27/56   Time 8   Period Weeks   Status New     PT LONG TERM GOAL #2   Title Increase 10 MWT to 1.0 m/s to improve  ability to safely ambulate in community   Baseline 12/18/16 .47 m/s   Time 8   Period Weeks     PT LONG TERM GOAL #3   Title Patient will be independent in home exercise program to improve strength/mobility for better functional independence with ADLs.   Baseline 12/18/16   Time 8   Period Weeks   Status New     PT LONG TERM GOAL #4   Title Patient will reduce timed up and go to <11 seconds to reduce fall risk and demonstrate improved transfer/gait ability.   Baseline 12/18/16 not tested due to safety   Time 8   Period Weeks   Status New               Plan - 01/01/17 1543    Clinical Impression Statement Patient agreed to increase weight on UE strengthening therex and tolerated it well with minimum-to-no fatigue.  Patient displays good recall of exercises and does not need as much cueing for correct form for scapular retraction and trunk stability.  Patient demonstrates weakness in quads and dorsiflexion lacks appropriate control for proper gait mechanics. Overall, patient is able to navigate the clinic more efficiently and seems to be increasing strength due to adhering to HEP. Patient will continue to benefit from skilled physical therapy to improve balance and gait mechanics to increase independence.     Rehab Potential Good   Clinical Impairments Affecting Rehab Potential Poor balance, weakness in ankle joint, decreased endurance   PT Frequency 2x / week   PT Duration 8 weeks   PT Treatment/Interventions ADLs/Self Care Home Management;Aquatic Therapy;Biofeedback;Electrical Stimulation;Moist Heat;Traction;Contrast Bath;Gait training;Stair training;Functional mobility training;Therapeutic activities;Therapeutic exercise;Balance training;Neuromuscular re-education;Patient/family education;Manual techniques;Passive range of motion;Energy conservation;Splinting;Taping   PT Next Visit Plan Continue strength therex of upper body and LEs   PT Home Exercise Plan Chin tucks, HS stretch,  piriformis stretch, and shoulder ER with theraband    Consulted and Agree with Plan of Care Patient      Patient will benefit from skilled therapeutic intervention in order to improve the following deficits and impairments:  Abnormal gait, Decreased coordination, Decreased range of motion, Difficulty walking, Decreased endurance, Decreased activity tolerance, Pain, Decreased balance, Hypomobility, Improper body mechanics, Decreased mobility, Decreased strength  Visit Diagnosis: Muscle weakness (generalized)  Other lack of coordination  Difficulty in walking, not elsewhere classified  Unsteadiness on feet     Problem List There are no active problems to display for this patient. This entire session was performed under direct supervision and direction of a licensed therapist/therapist assistant . I have personally read, edited and approve of the note as written. Madilyn Hookouglas Maryana Pittmon SPT  [address]  Kristine ArlingtonS [place], [place]PT, DPT 01/01/2017, 3:57 PM  [facility] REHAB  SERVICES [address] [place], [place], [address] Phone: [phone]   Fax:  [phone]  Name: Joshua [name] MRN: [id number] Date of Birth: 1976/01/29

## 2017-01-02 NOTE — Therapy (Signed)
[facility] [address] [place], [place], [address] Phone: [phone]   Fax:  [phone]  Occupational Therapy Treatment  Patient Details  Name: Joshua [name] MRN: [id number] Date of Birth: [birth year] No Data Recorded  Encounter Date: 01/01/2017      OT End of Session - 01/02/17 0900    Visit Number 4   Number of Visits 24   Authorization Type BCBS   OT Start Time 1600   OT Stop Time 1645   OT Time Calculation (min) 45 min   Activity Tolerance Patient tolerated treatment well   Behavior During Therapy [facility] for tasks assessed/performed      Past Medical History:  Diagnosis Date  . Hypertension   . Scoliosis     No past surgical history on file.  There were no vitals filed for this visit.      Subjective Assessment - 01/02/17 0859    Subjective  Pt. reports having less pain   Pertinent History [facility] and was admitted for 2.5 weeks including inpatient rehabilitation.  He did not receive home health and now presents for outpatient.     Patient Stated Goals Patient reports he would like to walk straighter, try to go back to work and also be able to take care of himself.    Currently in Pain? No/denies      OT TREATMENT    Neuro muscular re-education:  Pt. worked on tasks to sustain lateral pinch on resistive tweezers while grasping and moving 2" toothpick sticks from a horizontal flat position to a vertical position in order to place it in the holder. Pt. was able to sustain grasp while positioning and extending the wrist/hand in the necessary alignment needed to place the stick through the top of the holder. [facility]. performed [facility] skills training to improve speed and dexterity needed for ADL tasks and writing. Pt. demonstrated grasping 1 inch sticks,  inch cylindrical collars, and  inch flat washers on the Purdue pegboard. Pt. performed grasping each item with her  2nd digit and thumb, and storing them in the palm. Pt. presented with difficulty storing  inch objects at a time in the palmar aspect of the hand.  Therapeutic Exercise:  Pt. Performed hand strengthening with green theraputty. Pt. required cues for proper technique. Pt. worked on gross grip loop, lateral pinch, 3pt. pinch, gross digit extension, di digit abduction loop, thumb opposition. Pt. required verbal and tactile cues for proper technique. Pt. performed gross gripping with grip strengthener. Pt. worked on sustaining grip while grasping pegs and reaching at various heights. Gripper was placed in the 3rd resistive slot with the white resistive spring.                             OT Education - 01/02/17 0900    Education provided Yes   Education Details [facility]MC, hand strength   Person(s) Educated Patient   Methods Explanation   Comprehension Verbalized understanding             OT Long Term Goals - 12/20/16 2331      OT LONG TERM GOAL #1   Title Patient will improve B hand function with coordination to be able to tie his shoes independently.    Baseline assist with tying shoes.    Time 12   Period Weeks   Status New  OT LONG TERM GOAL #2   Title Patient will demonstrate donning and doffing shoes with modified independence.    Baseline unable to complete   Time 12   Period Weeks   Status New     OT LONG TERM GOAL #3   Title Patient will complete bathing with modified independence.    Baseline increased assistance at eval   Time 12   Period Weeks   Status New     OT LONG TERM GOAL #4   Title Patient will be independent with home exercise program   Baseline no current program   Time 8   Period Weeks   Status New     OT LONG TERM GOAL #5   Title Patient will complete light homemaking tasks with modified independence.   Baseline unable   Time 12   Period Weeks   Status New     Long Term Additional Goals   Additional Long Term Goals Yes      OT LONG TERM GOAL #6   Title Patient will improve grip strength in left hand by 5# to open jars and containers with modified independence.    Time 12   Period Weeks   Status New               Plan - 01/02/17 0901    Clinical Impression Statement Pt. continues to present with limited LUE strength, grip strength, pinch strength, and coordination skills. Pt. is improving with bilateral hand function, however continues to benefit from skilled OT services to work on increasing LUE strength, and coordination skills for improved ADL, and IADL functioning.    Occupational performance deficits (Please refer to evaluation for details): ADL's;IADL's   Rehab Potential Good   OT Frequency 2x / week   OT Duration 12 weeks   OT Treatment/Interventions Self-care/ADL training;Moist Heat;DME and/or AE instruction;Patient/family education;Therapeutic exercises;Balance training;Therapeutic exercise;Therapeutic activities;Neuromuscular education;Functional Mobility Training;Passive range of motion;Manual Therapy   Consulted and Agree with Plan of Care Patient      Patient will benefit from skilled therapeutic intervention in order to improve the following deficits and impairments:  Decreased coordination, Decreased range of motion, Difficulty walking, Decreased endurance, Decreased activity tolerance, Decreased knowledge of precautions, Decreased balance, Decreased knowledge of use of DME, Impaired UE functional use, Pain, Decreased strength  Visit Diagnosis: Muscle weakness (generalized)  Other lack of coordination    Problem List There are no active problems to display for this patient.   Olegario MessierElaine Kewanna Kasprzak, MS, OTR/L 01/02/2017, 9:06 AM  [place] [facility] [address] [place], [place], [address] Phone: [phone]   Fax:  [phone]  Name: Joshua [name] MRN: [id number] Date of Birth: [birth year]

## 2017-01-05 ENCOUNTER — Ambulatory Visit: Payer: BLUE CROSS/BLUE SHIELD | Admitting: Occupational Therapy

## 2017-01-05 ENCOUNTER — Encounter: Payer: Self-pay | Admitting: Physical Therapy

## 2017-01-05 ENCOUNTER — Ambulatory Visit: Payer: BLUE CROSS/BLUE SHIELD | Admitting: Physical Therapy

## 2017-01-05 DIAGNOSIS — M6281 Muscle weakness (generalized): Secondary | ICD-10-CM

## 2017-01-05 DIAGNOSIS — R278 Other lack of coordination: Secondary | ICD-10-CM

## 2017-01-05 DIAGNOSIS — R2681 Unsteadiness on feet: Secondary | ICD-10-CM

## 2017-01-05 DIAGNOSIS — R262 Difficulty in walking, not elsewhere classified: Secondary | ICD-10-CM

## 2017-01-05 NOTE — Therapy (Signed)
[facility] [address] [place], [place], [address] Phone: [phone]   Fax:  [phone]  Physical Therapy Treatment  Patient Details  Name: Joshua [name] MRN: [id number] Date of Birth: 06/03/1975 Referring Provider: Merrily BrittleAUCH, Joshua KARRAT  Encounter Date: 01/05/2017      PT End of Session - 01/05/17 1354    Visit Number 5   Number of Visits 17   Date for PT Re-Evaluation 02/12/17   Authorization Type BCBS   PT Start Time 1345   PT Stop Time 1430   PT Time Calculation (min) 45 min   Equipment Utilized During Treatment Gait belt   Activity Tolerance Patient tolerated treatment well;No increased pain   Behavior During Therapy WFL for tasks assessed/performed      Past Medical History:  Diagnosis Date  . Hypertension   . Scoliosis     History reviewed. No pertinent surgical history.  There were no vitals filed for this visit.      Subjective Assessment - 01/05/17 1353    Subjective Patient has no complaints of pain or soreness today. Overall, he thinks he is getting better.   Pertinent History Patient describes intermitten 3/10 pain in neck before scoliosis surgery. He reports that he was walking without  AD prior to surgery.  Patient had neck surgery in October of 2017, consisting of 13 screws in anterior neck, due to drastic change in balance and coordination.  Scoliosis surgery performed 11/27/16 with fusion of 13 vertebrae.  Patient denies dizziness, headaches, visual defecits, numbness, and tingling. Patient has 2 drains from abdomen.    Limitations Standing;Walking;Lifting   How long can you sit comfortably? 10 minutes   How long can you stand comfortably? 20 minutes   How long can you walk comfortably? 10 minutes   Patient Stated Goals Walk straight and be independent with ambulation   Currently in Pain? No/denies   Pain Score 0-No pain   Multiple Pain Sites No        Treatment: BLE: HS stretch  with strap 2 reps x 1 minute.  Piriformis stretch 1 minute each. Patient shows good mobility with trunk stability here Hip flexor stretch thomas position 1 minutes each. Patient's thigh is unable to touch the table due to tightness; good stretch here.   LAQ plus dorsiflexion 3 lbs on ankle 20 x each while holding swiss ball.  Patient instructed on 5 second hold and eccentric control while maintaining trunk stability.  Dorsiflexion 3 lbs on forefoot 2 x 20.  Less dorsiflexion in L ankle Leg press 90 lbs 2 x 10. Patient requires cueing to avoid L knee hyperextension.   Seated row on stool 22.5 lbs 3 sets x 8 reps. Patient responds well to tactile cueing for proper scapular retraction Lat pull down on stool 22.5 lbs 2 sets x 8. reps Scaption on stool 2 lbs 3 sets x 8 reps. Patient needs minimal cueing for upright posture; good   Ambulation around clinic 100 feet. Patient instructed to exaggerate stepping pattern to reinforce foot clearing ability and demonstrate proper heel strike.  Patient displays occasional impaired coordination with feet that worsens during turning                         PT Education - 01/05/17 1354    Education provided Yes   Education Details POC to improve posture, strength, and ROM    Person(s) Educated Patient   Methods Explanation  Comprehension Verbalized understanding             PT Long Term Goals - 12/18/16 1615      PT LONG TERM GOAL #1   Title Increase BERG score by 7 points to show decreased falls risk with ADLs   Baseline 12/18/16 27/56   Time 8   Period Weeks   Status New     PT LONG TERM GOAL #2   Title Increase 10 MWT to 1.0 m/s to improve ability to safely ambulate in community   Baseline 12/18/16 .47 m/s   Time 8   Period Weeks     PT LONG TERM GOAL #3   Title Patient will be independent in home exercise program to improve strength/mobility for better functional independence with ADLs.   Baseline 12/18/16   Time 8    Period Weeks   Status New     PT LONG TERM GOAL #4   Title Patient will reduce timed up and go to <11 seconds to reduce fall risk and demonstrate improved transfer/gait ability.   Baseline 12/18/16 not tested due to safety   Time 8   Period Weeks   Status New               Plan - 01/05/17 1428    Clinical Impression Statement Patient describes good adherence to HEP and states feeling stronger; increased weight was tolerated well.  Patient continues to show improvement with scapular positioning during UE therex with less cuing.  Patient completed leg press exercise well and displayed good concentric and eccentric control of B LE.  Ambulation with walker is still necessary for safety but patient demonstrates better stride length and heel strike after moderate visual and verbal cueing. Patient is moving more efficiently with no reports of pain. Patient will continue to benefit from skilled physical therapy to improve functional mobility, trunk stabilization, and strength to increase independence in mobility.   Rehab Potential Good   Clinical Impairments Affecting Rehab Potential Poor balance, weakness in ankle joint, decreased endurance   PT Frequency 2x / week   PT Duration 8 weeks   PT Treatment/Interventions ADLs/Self Care Home Management;Aquatic Therapy;Biofeedback;Electrical Stimulation;Moist Heat;Traction;Contrast Bath;Gait training;Stair training;Functional mobility training;Therapeutic activities;Therapeutic exercise;Balance training;Neuromuscular re-education;Patient/family education;Manual techniques;Passive range of motion;Energy conservation;Splinting;Taping   PT Next Visit Plan Continue strength and ROM therex of upper body and LEs   PT Home Exercise Plan Chin tucks and ambulating with walker as much as possible   Consulted and Agree with Plan of Care Patient      Patient will benefit from skilled therapeutic intervention in order to improve the following deficits and  impairments:  Abnormal gait, Decreased coordination, Decreased range of motion, Difficulty walking, Decreased endurance, Decreased activity tolerance, Pain, Decreased balance, Hypomobility, Improper body mechanics, Decreased mobility, Decreased strength  Visit Diagnosis: Muscle weakness (generalized)  Other lack of coordination  Difficulty in walking, not elsewhere classified  Unsteadiness on feet     Problem List There are no active problems to display for this patient. This entire session was performed under direct supervision and direction of a licensed therapist/therapist assistant . I have personally read, edited and approve of the note as written. [facility] [address] [place], [place] DPT 01/06/2017, 10:25 AM  [place] [facility] MAIN [facility] SERVICES [address] [place], [place], [address] Phone: [phone]   Fax:  [phone]  Name: Joshua [name] MRN: [id number] Date of Birth: 1975/09/25

## 2017-01-05 NOTE — Therapy (Signed)
[facility] [place] MAIN [facility] SERVICES [address] [place], [place], [address] Phone: [phone]   Fax:  [phone]  Occupational Therapy Treatment  Patient Details  Name: Joshua [name] MRN: [id number] Date of Birth: Oct 18, 1975 No Data Recorded  Encounter Date: 01/05/2017      OT End of Session - 01/05/17 1308    Visit Number 5   Number of Visits 24   Authorization Type BCBS   OT Start Time 1300   OT Stop Time 1345   OT Time Calculation (min) 45 min   Activity Tolerance Patient tolerated treatment well   Behavior During Therapy [facility] for tasks assessed/performed      Past Medical History:  Diagnosis Date  . Hypertension   . Scoliosis     No past surgical history on file.  There were no vitals filed for this visit.      Subjective Assessment - 01/05/17 1306    Subjective  Pt. reports no pain.   Pertinent History Patient underwent spinal surgery on June 28 at [facility] and was admitted for 2.5 weeks including inpatient rehabilitation.  He did not receive home health and now presents for outpatient.     Patient Stated Goals Patient reports he would like to walk straighter, try to go back to work and also be able to take care of himself.    Currently in Pain? No/denies      OT TREATMENT    Neuro muscular re-education:  Pt. performed [facility] skills training to improve speed and dexterity needed for ADL tasks and writing. Pt. demonstrated grasping 1 inch sticks,  inch cylindrical collars, and  inch flat washers on the Purdue pegboard. Pt. performed grasping each item with her 2nd digit and thumb, and storing them in the palm. Pt. presented with difficulty storing  inch objects at a time in the palmar aspect of the hand. Bilateral alternating hand movements.  Therapeutic Exercise:  Pt. performed gross gripping with grip strengthener. Pt. worked on sustaining grip while grasping pegs and reaching at various heights. Gripper was placed in  the 3rd resistive slot with the white resistive spring. Pt. Worked on pinch strengthening in the left hand for lateral, and 3pt. pinch using red, green, and blue resistive clips. Pt. worked on placing the clips at various vertical and horizontal angles. Tactile and verbal cues were required for eliciting the desired movement. Pt. Worked on the digiflex with his left hand.                              OT Education - 01/05/17 1307    Education provided Yes   Education Details FMC, strength             OT Long Term Goals - 12/20/16 2331      OT LONG TERM GOAL #1   Title Patient will improve B hand function with coordination to be able to tie his shoes independently.    Baseline assist with tying shoes.    Time 12   Period Weeks   Status New     OT LONG TERM GOAL #2   Title Patient will demonstrate donning and doffing shoes with modified independence.    Baseline unable to complete   Time 12   Period Weeks   Status New     OT LONG TERM GOAL #3   Title Patient will complete bathing with modified independence.    Baseline increased  assistance at eval   Time 12   Period Weeks   Status New     OT LONG TERM GOAL #4   Title Patient will be independent with home exercise program   Baseline no current program   Time 8   Period Weeks   Status New     OT LONG TERM GOAL #5   Title Patient will complete light homemaking tasks with modified independence.   Baseline unable   Time 12   Period Weeks   Status New     Long Term Additional Goals   Additional Long Term Goals Yes     OT LONG TERM GOAL #6   Title Patient will improve grip strength in left hand by 5# to open jars and containers with modified independence.    Time 12   Period Weeks   Status New               Plan - 01/05/17 1308    Clinical Impression Statement Pt.  reports he feels like he is improving. pt. is doing his theraputty exercises at home.  Pt. presents with Left UE  weaknes, and inccordination.  Pt. continues to work on improving UE strength, grip strength, and coordination skills in order to increase engagement in ADL, and IADL tasks.   Occupational performance deficits (Please refer to evaluation for details): ADL's;IADL's   Rehab Potential Good   OT Frequency 2x / week   OT Duration 12 weeks   OT Treatment/Interventions Self-care/ADL training;Moist Heat;DME and/or AE instruction;Patient/family education;Therapeutic exercises;Balance training;Therapeutic exercise;Therapeutic activities;Neuromuscular education;Functional Mobility Training;Passive range of motion;Manual Therapy   Consulted and Agree with Plan of Care Patient      Patient will benefit from skilled therapeutic intervention in order to improve the following deficits and impairments:  Decreased coordination, Decreased range of motion, Difficulty walking, Decreased endurance, Decreased activity tolerance, Decreased knowledge of precautions, Decreased balance, Decreased knowledge of use of DME, Impaired UE functional use, Pain, Decreased strength  Visit Diagnosis: Muscle weakness (generalized)  Other lack of coordination    Problem List There are no active problems to display for this patient.   Olegario MessierElaine Arsenio Schnorr, MS, OTR/L 01/05/2017, 1:40 PM  [place] [facility] [address] [place], [place], [address] Phone: [phone]   Fax:  [phone]  Name: Neysa HotterJames Underdown MRN: [id number] Date of Birth: [birth year]

## 2017-01-07 ENCOUNTER — Ambulatory Visit: Payer: BLUE CROSS/BLUE SHIELD | Admitting: Physical Therapy

## 2017-01-07 ENCOUNTER — Encounter: Payer: Self-pay | Admitting: Physical Therapy

## 2017-01-07 ENCOUNTER — Ambulatory Visit: Payer: BLUE CROSS/BLUE SHIELD | Admitting: Occupational Therapy

## 2017-01-07 DIAGNOSIS — R262 Difficulty in walking, not elsewhere classified: Secondary | ICD-10-CM

## 2017-01-07 DIAGNOSIS — R278 Other lack of coordination: Secondary | ICD-10-CM

## 2017-01-07 DIAGNOSIS — R2681 Unsteadiness on feet: Secondary | ICD-10-CM

## 2017-01-07 DIAGNOSIS — M6281 Muscle weakness (generalized): Secondary | ICD-10-CM

## 2017-01-07 NOTE — Therapy (Signed)
[facility] [address] [place], [place], [address] Phone: [phone]   Fax:  [phone]  Physical Therapy Treatment  Patient Details  Name: Joshua [name] MRN: [id number] Date of Birth: 07-04-1975 Referring Provider: Merrily BrittleAUCH, KIMBERLY KARRAT  Encounter Date: 01/07/2017      PT End of Session - 01/07/17 1718    Visit Number 6   Number of Visits 17   Date for PT Re-Evaluation 02/12/17   Authorization Type BCBS   PT Start Time 1515   PT Stop Time 1600   PT Time Calculation (min) 45 min   Equipment Utilized During Treatment Gait belt   Activity Tolerance Patient tolerated treatment well;No increased pain   Behavior During Therapy WFL for tasks assessed/performed      Past Medical History:  Diagnosis Date  . Hypertension   . Scoliosis     History reviewed. No pertinent surgical history.  There were no vitals filed for this visit.      Subjective Assessment - 01/07/17 1757    Subjective Pt reports no pain today, pt took medicine before coming to therapy; Pt goes to doctor next week;   Pertinent History Patient describes intermitten 3/10 pain in neck before scoliosis surgery. He reports that he was walking without  AD prior to surgery.  Patient had neck surgery in October of 2017, consisting of 13 screws in anterior neck, due to drastic change in balance and coordination.  Scoliosis surgery performed 11/27/16 with fusion of 13 vertebrae.  Patient denies dizziness, headaches, visual defecits, numbness, and tingling. Patient has 2 drains from abdomen.    Limitations Standing;Walking;Lifting   How long can you sit comfortably? 10 minutes   How long can you stand comfortably? 20 minutes   How long can you walk comfortably? 10 minutes   Patient Stated Goals Walk straight and be independent with ambulation   Currently in Pain? No/denies   Multiple Pain Sites No      PT TREATMENT;     Nu Step; BLE x 4 min >70 spm  (unbilled)  Sitting on stool; Bilat rows; R/L 3 x 8 22.5# Lat pull down; R/L 3 x 8 17.5# Pt cued to incorporate scapular retraction at end of exercise;   Supine; Thomas test stretch; 3 x 30 sec stretch; to increase hip flexor flexibility;  SLR; x 10 ea; L LE PT assist to lift leg and cue to slowly lower leg; Pt able to lift R LE and perform exercise with no assist; cue for slow controlled movement for better muscle activation; Hip abduction and external rotation in hooklying; blue band 2 x 10 with a 3 sec hold at end range;  SAQ; x 10 ea with cues for complete LE extension in order to improve muscle activation;   Pt tolerated treatment well                        PT Education - 01/07/17 1718    Education provided Yes   Education Details LE and UE strengthening; stretching   Person(s) Educated Patient   Methods Explanation;Tactile cues;Demonstration;Verbal cues   Comprehension Verbalized understanding;Returned demonstration;Verbal cues required             PT Long Term Goals - 12/18/16 1615      PT LONG TERM GOAL #1   Title Increase BERG score by 7 points to show decreased falls risk with ADLs   Baseline 12/18/16 27/56   Time 8  Period Weeks   Status New     PT LONG TERM GOAL #2   Title Increase 10 MWT to 1.0 m/s to improve ability to safely ambulate in community   Baseline 12/18/16 .47 m/s   Time 8   Period Weeks     PT LONG TERM GOAL #3   Title Patient will be independent in home exercise program to improve strength/mobility for better functional independence with ADLs.   Baseline 12/18/16   Time 8   Period Weeks   Status New     PT LONG TERM GOAL #4   Title Patient will reduce timed up and go to <11 seconds to reduce fall risk and demonstrate improved transfer/gait ability.   Baseline 12/18/16 not tested due to safety   Time 8   Period Weeks   Status New               Plan - 01/07/17 1719    Clinical Impression Statement Pt was  instructed in LE  and UE strengthening exercises; Pt demonstrates good technique with UE exercises and was able to increase weight with the bilat rows; Pt requires min verbal and tactile cues for incorporating the scapular retraction throughout; Pt performed SLR and had difficulty with exercise; Pt showed more weakness on L LE than R; pt required verbal cues through leg strengthening in order to increase quad muscle activation; Pt will continue to benefit from skilled PT in order to improve strength, balance, and gait safety;    Rehab Potential Good   Clinical Impairments Affecting Rehab Potential Poor balance, weakness in ankle joint, decreased endurance   PT Frequency 2x / week   PT Duration 8 weeks   PT Treatment/Interventions ADLs/Self Care Home Management;Aquatic Therapy;Biofeedback;Electrical Stimulation;Moist Heat;Traction;Contrast Bath;Gait training;Stair training;Functional mobility training;Therapeutic activities;Therapeutic exercise;Balance training;Neuromuscular re-education;Patient/family education;Manual techniques;Passive range of motion;Energy conservation;Splinting;Taping   PT Next Visit Plan Continue strength and ROM therex of upper body and LEs   PT Home Exercise Plan Chin tucks and ambulating with walker as much as possible   Consulted and Agree with Plan of Care Patient      Patient will benefit from skilled therapeutic intervention in order to improve the following deficits and impairments:  Abnormal gait, Decreased coordination, Decreased range of motion, Difficulty walking, Decreased endurance, Decreased activity tolerance, Pain, Decreased balance, Hypomobility, Improper body mechanics, Decreased mobility, Decreased strength  Visit Diagnosis: Muscle weakness (generalized)  Other lack of coordination  Difficulty in walking, not elsewhere classified  Unsteadiness on feet     Problem List There are no active problems to display for this patient.  Albertha Beattie  SPT  This entire session was performed under direct supervision and direction of a licensed Estate agent . I have personally read, edited and approve of the note as written. Cornell Barman, PT, DPT 01/12/17 8:54 AM   [place] [facility] MAIN [facility] SERVICES [address] [place], [place], [address] Phone: [phone]   Fax:  [phone]  Name: Joshua [name] MRN: [id number] Date of Birth: [birth year]

## 2017-01-07 NOTE — Therapy (Signed)
[place] [facility] [address] [place], [place], [address] Phone: [phone]   Fax:  [phone]  Occupational Therapy Treatment  Patient Details  Name: Neysa HotterJames Dymond MRN: [id number] Date of Birth: 30-Aug-1975 No Data Recorded  Encounter Date: 01/07/2017      OT End of Session - 01/07/17 1715    Visit Number 6   Number of Visits 24   Authorization Type BCBS   OT Start Time 1430   OT Stop Time 1515   OT Time Calculation (min) 45 min   Activity Tolerance Patient tolerated treatment well   Behavior During Therapy [facility] for tasks assessed/performed      Past Medical History:  Diagnosis Date  . Hypertension   . Scoliosis     No past surgical history on file.  There were no vitals filed for this visit.      Subjective Assessment - 01/07/17 1713    Subjective  Pt. reports having to take flexoril this morning secondary to pain.   Pertinent History Patient [facility] and was admitted for 2.5 weeks including inpatient rehabilitation.  He did not receive home health and now presents for outpatient.     Patient Stated Goals Patient reports he would like to walk straighter, try to go back to work and also be able to take care of himself.    Currently in Pain? Yes   Pain Score 3       OT TREATMENT    Neuro muscular re-education:  Pt. performed [facility] skills training to improve speed and dexterity needed for ADL tasks and writing. Pt. demonstrated grasping 1 inch sticks,  inch cylindrical collars, and  inch flat washers on the Purdue pegboard. Pt. performed grasping each item with her 2nd digit and thumb, and storing them in the palm. Pt. presented with difficulty storing  inch objects at a time in the palmar aspect of the hand. Pt.worked on alternating bilateral hand movements.  Therapeutic Exercise:  Pt. performed gross gripping with grip strengthener. Pt. worked on sustaining grip  while grasping pegs and reaching at various heights. Gripper was placed in the 3rd resistive slot with the white resistive spring. Pt. worked on pinch strengthening in the left hand for lateral, and 3pt. pinch using red, green, and blue resistive clips. Pt. worked on placing the clips at various vertical and horizontal angles. Tactile and verbal cues were required for eliciting the desired movement.                             OT Education - 01/07/17 1714    Education provided Yes   Education Details Left hand strength, and coordination   Person(s) Educated Patient   Methods Explanation   Comprehension Verbalized understanding             OT Long Term Goals - 12/20/16 2331      OT LONG TERM GOAL #1   Title Patient will improve B hand function with coordination to be able to tie his shoes independently.    Baseline assist with tying shoes.    Time 12   Period Weeks   Status New     OT LONG TERM GOAL #2   Title Patient will demonstrate donning and doffing shoes with modified independence.    Baseline unable to complete   Time 12   Period Weeks   Status New  OT LONG TERM GOAL #3   Title Patient will complete bathing with modified independence.    Baseline increased assistance at eval   Time 12   Period Weeks   Status New     OT LONG TERM GOAL #4   Title Patient will be independent with home exercise program   Baseline no current program   Time 8   Period Weeks   Status New     OT LONG TERM GOAL #5   Title Patient will complete light homemaking tasks with modified independence.   Baseline unable   Time 12   Period Weeks   Status New     Long Term Additional Goals   Additional Long Term Goals Yes     OT LONG TERM GOAL #6   Title Patient will improve grip strength in left hand by 5# to open jars and containers with modified independence.    Time 12   Period Weeks   Status New               Plan - 01/07/17 1715    Clinical  Impression Statement Pt. reports 3/10 pain in his left shoulder today. Pt. reports he had to take his flexoril this morning. Pt. is making progress, and is now picking up things easier at home. Pt. continues to present with LUE pain, weakness, and impaired [facility] skills which impact his ability to complete basic ADL care, and IADLs. Pt. continues to work on improving LUE strength, and coordination skills for improved engagement in ADL, and IADL tasks.   Occupational performance deficits (Please refer to evaluation for details): ADL's;IADL's   Rehab Potential Good   OT Frequency 2x / week   OT Treatment/Interventions Self-care/ADL training;Moist Heat;DME and/or AE instruction;Patient/family education;Therapeutic exercises;Balance training;Therapeutic exercise;Therapeutic activities;Neuromuscular education;Functional Mobility Training;Passive range of motion;Manual Therapy   Consulted and Agree with Plan of Care Patient      Patient will benefit from skilled therapeutic intervention in order to improve the following deficits and impairments:  Decreased coordination, Decreased range of motion, Difficulty walking, Decreased endurance, Decreased activity tolerance, Decreased knowledge of precautions, Decreased balance, Decreased knowledge of use of DME, Impaired UE functional use, Pain, Decreased strength  Visit Diagnosis: Muscle weakness (generalized)  Other lack of coordination    Problem List There are no active problems to display for this patient.   [name], MS, OTR/L 01/07/2017, 5:17 PM  [facility] MAIN [facility] SERVICES [address] [place], [place], [address] Phone: [phone]   Fax:  [phone]  Name: Thaddeaus Monica MRN: [id number] Date of Birth: 1976-04-26

## 2017-01-12 ENCOUNTER — Ambulatory Visit: Payer: BLUE CROSS/BLUE SHIELD | Admitting: Occupational Therapy

## 2017-01-12 ENCOUNTER — Encounter: Payer: Self-pay | Admitting: Physical Therapy

## 2017-01-12 ENCOUNTER — Ambulatory Visit: Payer: BLUE CROSS/BLUE SHIELD | Admitting: Physical Therapy

## 2017-01-12 DIAGNOSIS — R2681 Unsteadiness on feet: Secondary | ICD-10-CM

## 2017-01-12 DIAGNOSIS — R278 Other lack of coordination: Secondary | ICD-10-CM

## 2017-01-12 DIAGNOSIS — M6281 Muscle weakness (generalized): Secondary | ICD-10-CM

## 2017-01-12 DIAGNOSIS — R262 Difficulty in walking, not elsewhere classified: Secondary | ICD-10-CM

## 2017-01-12 NOTE — Therapy (Signed)
[facility] [address] [place], [place], [address] Phone: [phone]   Fax:  [phone]  Physical Therapy Treatment  Patient Details  Name: Joshua HotterJames Rueter MRN: [id number] Date of Birth: 1976-02-01 Referring Provider: Merrily BrittleAUCH, KIMBERLY KARRAT  Encounter Date: 01/12/2017      PT End of Session - 01/12/17 1353    Visit Number 7   Number of Visits 17   Date for PT Re-Evaluation 02/12/17   Authorization Type BCBS   PT Start Time 1345   PT Stop Time 1430   PT Time Calculation (min) 45 min   Equipment Utilized During Treatment Gait belt   Activity Tolerance Patient tolerated treatment well;No increased pain   Behavior During Therapy WFL for tasks assessed/performed      Past Medical History:  Diagnosis Date  . Hypertension   . Scoliosis     History reviewed. No pertinent surgical history.  There were no vitals filed for this visit.      Subjective Assessment - 01/12/17 1351    Subjective Patient has nothing new to report and is not in pain currently   Pertinent History Patient describes intermitten 3/10 pain in neck before scoliosis surgery. He reports that he was walking without  AD prior to surgery.  Patient had neck surgery in October of 2017, consisting of 13 screws in anterior neck, due to drastic change in balance and coordination.  Scoliosis surgery performed 11/27/16 with fusion of 13 vertebrae.  Patient denies dizziness, headaches, visual defecits, numbness, and tingling. Patient has 2 drains from abdomen.    Limitations Standing;Walking;Lifting   How long can you sit comfortably? 10 minutes   How long can you stand comfortably? 20 minutes   How long can you walk comfortably? 10 minutes   Patient Stated Goals Walk straight and be independent with ambulation       Treatment:   Nu Step warm up 5 minutes   There-ex Lat pull downs 17.5 lbs with rope 1 x 15 and 3 x 8. Patient tolerated increased reps well  today and demonstrated good form throughout. Rows 22.5 lbs with multi-purpose bar 1 x 15 and 3 x 8. Increased repetitions were tolerated with minimal fatigue. Scaption 1 lb dumbbell 2 x 5. Patient demonstrates better ROM in both shoulders here; fatigue at end of sets. Dorsiflexion 3 lbs cuff weight. Patient instructed to hold for ~1 second per repetition and to go until muscle exhaustion was achieved; 100 reps. Hip hiking in seated position with 8 lbs cuff weights on ankle 2 x 20. Patient is challenged to keep weights on ankle due to decrease tib ant strength. Patient was able to avoid UE assist to challenge core stability without difficulty.  HS stretch with strap 3 x 30 seconds. Moderate cues to keep knee straight, patient was lifting his leg up too high.   Clams with GTB around knees 20 x each. Patient does not experience back pain during exercise.   Neuromuscular Re-education AAROM pulleys 15 x 10 seconds. Patient is able to achieve more flexion ROM with this exercise. Patient describes a good stretch here in both shoulders. Patient shows slight bias towards abduction and corrected with mild success with PT assistance.                          PT Education - 01/12/17 1352    Education provided Yes   Education Details Continuing to progress strength of B LE  and core   Person(s) Educated Patient   Methods Explanation   Comprehension Verbalized understanding             PT Long Term Goals - 12/18/16 1615      PT LONG TERM GOAL #1   Title Increase BERG score by 7 points to show decreased falls risk with ADLs   Baseline 12/18/16 27/56   Time 8   Period Weeks   Status New     PT LONG TERM GOAL #2   Title Increase 10 MWT to 1.0 m/s to improve ability to safely ambulate in community   Baseline 12/18/16 .47 m/s   Time 8   Period Weeks     PT LONG TERM GOAL #3   Title Patient will be independent in home exercise program to improve strength/mobility for better  functional independence with ADLs.   Baseline 12/18/16   Time 8   Period Weeks   Status New     PT LONG TERM GOAL #4   Title Patient will reduce timed up and go to <11 seconds to reduce fall risk and demonstrate improved transfer/gait ability.   Baseline 12/18/16 not tested due to safety   Time 8   Period Weeks   Status New               Plan - 01/12/17 1430    Clinical Impression Statement Patient demonstrates good recall of exercises and is needing less VCs for proper form.  Increased repetitions for UE strengthening therex was tolerated well with minimal fatigue and no fault in form.  Patient displays impaired AROM of shoulder flexion and abduction that was addressed with pulley therex; patient was able to achieve more ROM with this exercise.  Patient still displays impaired B LE muscle development but improvement with endurance and strength noted. Patient will continue to benefit from skilled physical therapy to improve mobility and strength of B UE and B LE to increase independence.    Rehab Potential Good   Clinical Impairments Affecting Rehab Potential Poor balance, weakness in ankle joint, decreased endurance   PT Frequency 2x / week   PT Duration 8 weeks   PT Treatment/Interventions ADLs/Self Care Home Management;Aquatic Therapy;Biofeedback;Electrical Stimulation;Moist Heat;Traction;Contrast Bath;Gait training;Stair training;Functional mobility training;Therapeutic activities;Therapeutic exercise;Balance training;Neuromuscular re-education;Patient/family education;Manual techniques;Passive range of motion;Energy conservation;Splinting;Taping   PT Next Visit Plan Continue strength and ROM therex of upper body and LEs   PT Home Exercise Plan Chin tucks and ambulating with walker as much as possible   Consulted and Agree with Plan of Care Patient      Patient will benefit from skilled therapeutic intervention in order to improve the following deficits and impairments:  Abnormal  gait, Decreased coordination, Decreased range of motion, Difficulty walking, Decreased endurance, Decreased activity tolerance, Pain, Decreased balance, Hypomobility, Improper body mechanics, Decreased mobility, Decreased strength  Visit Diagnosis: Muscle weakness (generalized)  Difficulty in walking, not elsewhere classified  Unsteadiness on feet     Problem List There are no active problems to display for this patient.  This entire session was performed under direct supervision and direction of a licensed therapist/therapist assistant . I have personally read, edited and approve of the note as written. Maximino Greenland PT [place], [place], [place] DPT 01/12/2017, 4:55 PM  [facility] MAIN [facility] SERVICES [address] [place], [place], [address] Phone: [phone]   Fax:  [phone]  Name: Kaye Mitro MRN: [id number] Date of Birth: 1975/08/23

## 2017-01-12 NOTE — Therapy (Signed)
[place] [facility] [address] [place], [place], [address] Phone: [phone]   Fax:  [phone]  Occupational Therapy Treatment  Patient Details  Name: Joshua [name] MRN: [id number] Date of Birth: 1975-08-15 No Data Recorded  Encounter Date: 01/12/2017      OT End of Session - 01/12/17 1310    Visit Number 7   Number of Visits 24   Date for OT Re-Evaluation 03/10/17   Authorization Type BCBS   OT Start Time 1300   OT Stop Time 1345   OT Time Calculation (min) 45 min   Activity Tolerance Patient tolerated treatment well   Behavior During Therapy [facility] for tasks assessed/performed      Past Medical History:  Diagnosis Date  . Hypertension   . Scoliosis     No past surgical history on file.  There were no vitals filed for this visit.      Subjective Assessment - 01/12/17 1305    Subjective  Pt. reports when he woke this morning, he had 5/10 pain. in his left shoulder.   Pertinent History Patient [facility] and was admitted for 2.5 weeks including inpatient rehabilitation.  He did not receive home health and now presents for outpatient.     Patient Stated Goals Patient reports he would like to walk straighter, try to go back to work and also be able to take care of himself.    Currently in Pain? Yes   Pain Score 2    Pain Location Shoulder   Pain Orientation Left         OT TREATMENT    Neuro muscular re-education:  Pt. performed FMC tasks using the Grooved pegboard. Pt. worked on grasping the grooved pegs from a horizontal position, and moving the pegs to a vertical position in the hand to prepare for placing them in the grooved slot.   Therapeutic Exercise:  Pt. performed gross gripping with grip strengthener. Pt. worked on sustaining grip while grasping pegs and reaching at various heights. Gripper was placed in the 4th, followed by the 3rd resistive slot with the  white resistive spring. Pt. worked on pinch strengthening in the left hand for lateral, and 3pt. pinch using yellow, red, green, and blue resistive clips. Pt. worked on placing the clips at various vertical and horizontal angles. Pt. performed resistive EZ Board exercises for forearm supination/pronation, wrist flexion/extension using gross grasp, and lateral pinch (key) grasp. Pt. performed resistive EZ Board exercises angled in several planes to promote shoulder flexion, abduction, and wrist flexion, and extension while performing resistive wrist flexion and extension with a gross grip.                          OT Education - 01/12/17 1308    Education provided Yes   Education Details UE ROM, strength, and coordination skills.   Person(s) Educated Patient   Methods Explanation;Demonstration;Verbal cues   Comprehension Verbalized understanding;Returned demonstration;Verbal cues required             OT Long Term Goals - 12/20/16 2331      OT LONG TERM GOAL #1   Title Patient will improve B hand function with coordination to be able to tie his shoes independently.    Baseline assist with tying shoes.    Time 12   Period Weeks   Status New     OT LONG TERM GOAL #2  Title Patient will demonstrate donning and doffing shoes with modified independence.    Baseline unable to complete   Time 12   Period Weeks   Status New     OT LONG TERM GOAL #3   Title Patient will complete bathing with modified independence.    Baseline increased assistance at eval   Time 12   Period Weeks   Status New     OT LONG TERM GOAL #4   Title Patient will be independent with home exercise program   Baseline no current program   Time 8   Period Weeks   Status New     OT LONG TERM GOAL #5   Title Patient will complete light homemaking tasks with modified independence.   Baseline unable   Time 12   Period Weeks   Status New     Long Term Additional Goals   Additional Long  Term Goals Yes     OT LONG TERM GOAL #6   Title Patient will improve grip strength in left hand by 5# to open jars and containers with modified independence.    Time 12   Period Weeks   Status New               Plan - 01/12/17 1316    Clinical Impression Statement Pt. reports he had 5/10 pain in his left shoulder upon waking this morning. Pt. reports he had to take pain medicine this morning. Pt. reports 2/10 left neck, and shoulder pain. Pt. education was provided about postioning.  Pt. continues to work on improving LUE strength, and coordination skills for use during ADLs, and IADLs.   Occupational performance deficits (Please refer to evaluation for details): ADL's;IADL's   Rehab Potential Good   OT Frequency 2x / week   OT Duration 12 weeks   OT Treatment/Interventions Self-care/ADL training;Moist Heat;DME and/or AE instruction;Patient/family education;Therapeutic exercises;Balance training;Therapeutic exercise;Therapeutic activities;Neuromuscular education;Functional Mobility Training;Passive range of motion;Manual Therapy   Consulted and Agree with Plan of Care Patient      Patient will benefit from skilled therapeutic intervention in order to improve the following deficits and impairments:  Decreased coordination, Decreased range of motion, Difficulty walking, Decreased endurance, Decreased activity tolerance, Decreased knowledge of precautions, Decreased balance, Decreased knowledge of use of DME, Impaired UE functional use, Pain, Decreased strength  Visit Diagnosis: Muscle weakness (generalized)  Other lack of coordination    Problem List There are no active problems to display for this patient.   [name], MS, OTR/L 01/12/2017, 1:38 PM  [place] [facility] MAIN [facility] SERVICES [address] [place], [place], [address] Phone: [phone]   Fax:  [phone]  Name: Joshua [name] MRN: [id number] Date of Birth: Apr 25, 1976

## 2017-01-14 ENCOUNTER — Ambulatory Visit: Payer: BLUE CROSS/BLUE SHIELD | Admitting: Physical Therapy

## 2017-01-14 ENCOUNTER — Encounter: Payer: Self-pay | Admitting: Physical Therapy

## 2017-01-14 ENCOUNTER — Ambulatory Visit: Payer: BLUE CROSS/BLUE SHIELD | Admitting: Occupational Therapy

## 2017-01-14 DIAGNOSIS — R278 Other lack of coordination: Secondary | ICD-10-CM

## 2017-01-14 DIAGNOSIS — R2681 Unsteadiness on feet: Secondary | ICD-10-CM

## 2017-01-14 DIAGNOSIS — M6281 Muscle weakness (generalized): Secondary | ICD-10-CM

## 2017-01-14 DIAGNOSIS — R262 Difficulty in walking, not elsewhere classified: Secondary | ICD-10-CM

## 2017-01-14 NOTE — Therapy (Signed)
[place] [facility] MAIN [facility] SERVICES [address] [place], [place], [address] Phone: [phone]   Fax:  [phone]  Occupational Therapy Treatment  Patient Details  Name: Joshua [name] MRN: [id number] Date of Birth: 28-Dec-1975 No Data Recorded  Encounter Date: 01/14/2017      OT End of Session - 01/14/17 1309    Visit Number 8   Number of Visits 24   Date for OT Re-Evaluation 03/10/17   Authorization Type BCBS   OT Start Time 1300   OT Stop Time 1345   OT Time Calculation (min) 45 min   Activity Tolerance Patient tolerated treatment well   Behavior During Therapy [facility] for tasks assessed/performed      Past Medical History:  Diagnosis Date  . Hypertension   . Scoliosis     No past surgical history on file.  There were no vitals filed for this visit.      Subjective Assessment - 01/14/17 1306    Subjective  Pt. reports taking a flexoril in the middle of the night.   Pertinent History Patient underwent spinal surgery on June 28 at [facility] and was admitted for 2.5 weeks including inpatient rehabilitation.  He did not receive home health and now presents for outpatient.     Patient Stated Goals Patient reports he would like to walk straighter, try to go back to work and also be able to take care of himself.    Currently in Pain? Yes      OT TREATMENT    Neuro muscular re-education:  Pt. worked on grasping 1" resistive cubes alternating thumb opposition to the tip of the 2nd through 5th digits while the board is placed at a vertical angle. Pt. worked on pressing the cubes back into place while alternating isolated 2nd through 5th digit extension.  Therapeutic Exercise:  2# dumbbell ex. for left elbow flexion and extension, forearm supination/pronation, wrist flexion/extension, and radial deviation. Pt. requires rest breaks and verbal cues for proper technique.  2 sets 10 reps each Pt. performed resistive EZ Board exercises for  forearm supination/pronation, wrist flexion/extension using gross grasp, and lateral pinch (key) grasp. Pt. performed resistive EZ Board exercises angled in several planes to promote wrist flexion, and extension while performing resistive wrist flexion and extension with a gross grip. Pt. Worked on pinch strengthening in the left hand for lateral, and 3pt. pinch using red, and green resistive clips. Pt. worked on placing the clips at various vertical and horizontal angles. Tactile and verbal cues were required for eliciting the desired movement.                             OT Education - 01/14/17 1307    Education provided Yes   Education Details LUE strengh, and coordination   Person(s) Educated Patient   Methods Explanation   Comprehension Verbal cues required;Verbalized understanding;Returned demonstration             OT Long Term Goals - 12/20/16 2331      OT LONG TERM GOAL #1   Title Patient will improve B hand function with coordination to be able to tie his shoes independently.    Baseline assist with tying shoes.    Time 12   Period Weeks   Status New     OT LONG TERM GOAL #2   Title Patient will demonstrate donning and doffing shoes with modified independence.    Baseline unable  to complete   Time 12   Period Weeks   Status New     OT LONG TERM GOAL #3   Title Patient will complete bathing with modified independence.    Baseline increased assistance at eval   Time 12   Period Weeks   Status New     OT LONG TERM GOAL #4   Title Patient will be independent with home exercise program   Baseline no current program   Time 8   Period Weeks   Status New     OT LONG TERM GOAL #5   Title Patient will complete light homemaking tasks with modified independence.   Baseline unable   Time 12   Period Weeks   Status New     Long Term Additional Goals   Additional Long Term Goals Yes     OT LONG TERM GOAL #6   Title Patient will improve grip  strength in left hand by 5# to open jars and containers with modified independence.    Time 12   Period Weeks   Status New               Plan - 01/14/17 1309    Clinical Impression Statement pt. reports having had a doctor's appointment yesterday with a neurosurgeon, and a Engineer, petroleumplastic surgeon. Pt. continues to present with LUE weakness, and incoordination. Pt. continues to improve LUE functioning for ADLs, and IADLs.   Occupational performance deficits (Please refer to evaluation for details): ADL's;IADL's   Rehab Potential Good   OT Frequency 2x / week   OT Duration 12 weeks   OT Treatment/Interventions Self-care/ADL training;Moist Heat;DME and/or AE instruction;Patient/family education;Therapeutic exercises;Balance training;Therapeutic exercise;Therapeutic activities;Neuromuscular education;Functional Mobility Training;Passive range of motion;Manual Therapy   Consulted and Agree with Plan of Care Patient      Patient will benefit from skilled therapeutic intervention in order to improve the following deficits and impairments:  Decreased coordination, Decreased range of motion, Difficulty walking, Decreased endurance, Decreased activity tolerance, Decreased knowledge of precautions, Decreased balance, Decreased knowledge of use of DME, Impaired UE functional use, Pain, Decreased strength  Visit Diagnosis: Muscle weakness (generalized)  Other lack of coordination    Problem List There are no active problems to display for this patient.   Olegario MessierElaine Gerald Kuehl, MS, OTR/L 01/14/2017, 1:15 PM  [facility] [address] , [place], [address] Phone: [phone]   Fax:  [phone]  Name: Neysa HotterJames Stencel MRN: [id number] Date of Birth: [birth year]

## 2017-01-14 NOTE — Therapy (Signed)
[place] [facility] MAIN [facility] SERVICES [address] [place], [place], [address] Phone: [phone]   Fax:  [phone]  Physical Therapy Treatment  Patient Details  Name: Joshua [name] MRN: [id number] Date of Birth: [birth year] Referring Provider: Merrily Brittle  Encounter Date: 01/14/2017      PT End of Session - 01/14/17 1352    Visit Number 8   Number of Visits 17   Date for PT Re-Evaluation 02/12/17   Authorization Type BCBS   PT Start Time 0145   PT Stop Time 0230   PT Time Calculation (min) 45 min   Equipment Utilized During Treatment Gait belt   Activity Tolerance Patient tolerated treatment well;No increased pain   Behavior During Therapy WFL for tasks assessed/performed      Past Medical History:  Diagnosis Date  . Hypertension   . Scoliosis     History reviewed. No pertinent surgical history.  There were no vitals filed for this visit.      Subjective Assessment - 01/14/17 1350    Subjective Patient had follow up appt with MD; soft back brace was removed, patch over scar was replaced, and no other remarks from MD. Patient states having L sided neck pain in the morning that goes away during the day.    Pertinent History Patient describes intermitten 3/10 pain in neck before scoliosis surgery. He reports that he was walking without  AD prior to surgery.  Patient had neck surgery in October of 2017, consisting of 13 screws in anterior neck, due to drastic change in balance and coordination.  Scoliosis surgery performed 11/27/16 with fusion of 13 vertebrae.  Patient denies dizziness, headaches, visual defecits, numbness, and tingling. Patient has 2 drains from abdomen.    Limitations Standing;Walking;Lifting   How long can you sit comfortably? 10 minutes   How long can you stand comfortably? 20 minutes   How long can you walk comfortably? 10 minutes   Patient Stated Goals Walk straight and be independent with ambulation   Currently in Pain? No/denies   Pain Score 0-No pain   Multiple Pain Sites No       Treatment:  Nu Step warm up 5 minutes  There-ex Lat pull downs at tower 17.5 lbs with rope 4 x 10. Patient handled sets and repetition change well with minimal fatigue; still presented with good form throughout exercise Rows at tower 22.5 lbs with multi-purpose handle 4 x 10.  Patient handled sets and repetition change well with minimal fatigue; still presented with good form throughout exercise Horizontal shoulder retractions unilaterally at tower 2.5 lbs. Patient starts at 90 degrees of abduction and performs subtle scapular retraction without twisting/leaning with his trunk.  Scaption 1 lbs 2 x 7. Patient exhibits good scapular stability with B UE elevation Seated hip hiking 10 lbs weight on thigh 2 x 25 each. Patient instructed to avoid leaning on UE for balance to challenge core stability.  LAQ with 10 lbs weight on ankle 2 x 20 each. Patient was able to avoid UE assist for balance. Patient   Neuromuscular Re-education Pulleys AAROM 15 x 5 seconds.  Patient shows good improvement with avoiding abduction bias and ROM.  AROM shoulder flexion with stick 10 x 5 seconds. Instruction to keep lower back on table to reduce lumbar extension                        PT Education - 01/14/17 1352    Education  provided Yes   Education Details Increase functional mobilty, bilateral shoulder ROM and strength   Person(s) Educated Patient   Methods Explanation   Comprehension Verbalized understanding             PT Long Term Goals - 01/14/17 1432      PT LONG TERM GOAL #1   Title Increase BERG score by 7 points to show decreased falls risk with ADLs   Baseline 12/18/16 27/56   Time 8   Period Weeks   Status On-going     PT LONG TERM GOAL #2   Title Increase 10 MWT to 1.0 m/s to improve ability to safely ambulate in community   Baseline 12/18/16 .47 m/s   Time 8   Period Weeks    Status On-going     PT LONG TERM GOAL #3   Title Patient will be independent in home exercise program to improve strength/mobility for better functional independence with ADLs.   Baseline 12/18/16   Time 8   Period Weeks   Status On-going     PT LONG TERM GOAL #4   Title Patient will reduce timed up and go to <11 seconds to reduce fall risk and demonstrate improved transfer/gait ability.   Baseline 12/18/16 not tested due to safety   Time 8   Period Weeks   Status On-going               Plan - 01/14/17 1421    Clinical Impression Statement Pateint tolerated increased repetitions and weight well today. Patient re-educated on plan of care to improve B LE strength to be able to ambulate without his walker with good understanding. Patient shows good improvement in shoulder elevation and is able to complete all therex with minimal rest breaks.  Overall, patient seems to be responding well to therapy and is able to navigate the clinic more efficiently. Patient will continue to benefit from skilled physical therapy to improve dynamic balance and endurance to increase independent mobility.    Rehab Potential Good   Clinical Impairments Affecting Rehab Potential Poor balance, weakness in ankle joint, decreased endurance   PT Frequency 2x / week   PT Duration 8 weeks   PT Treatment/Interventions ADLs/Self Care Home Management;Aquatic Therapy;Biofeedback;Electrical Stimulation;Moist Heat;Traction;Contrast Bath;Gait training;Stair training;Functional mobility training;Therapeutic activities;Therapeutic exercise;Balance training;Neuromuscular re-education;Patient/family education;Manual techniques;Passive range of motion;Energy conservation;Splinting;Taping   PT Next Visit Plan Continue strength and ROM therex of upper body and LEs   PT Home Exercise Plan ambulating with walker as much as possible   Consulted and Agree with Plan of Care Patient      Patient will benefit from skilled  therapeutic intervention in order to improve the following deficits and impairments:  Abnormal gait, Decreased coordination, Decreased range of motion, Difficulty walking, Decreased endurance, Decreased activity tolerance, Pain, Decreased balance, Hypomobility, Improper body mechanics, Decreased mobility, Decreased strength  Visit Diagnosis: Muscle weakness (generalized)  Difficulty in walking, not elsewhere classified  Other lack of coordination  Unsteadiness on feet     Problem List There are no active problems to display for this patient. This entire session was performed under direct supervision and direction of a licensed therapist/therapist assistant . I have personally read, edited and approve of the note as written. [facility] Vickye Astorino [address]PT  [place], WaldoKristine S, [place] DPT 01/14/2017, 2:33 PM  [place] [facility] [address] [place], [place], [address] Phone: [phone]   Fax:  [phone]  Name: Neysa HotterJames Joss MRN: [id number] Date of  Birth: 1975-10-03

## 2017-01-19 ENCOUNTER — Ambulatory Visit: Payer: BLUE CROSS/BLUE SHIELD | Admitting: Occupational Therapy

## 2017-01-19 ENCOUNTER — Ambulatory Visit: Payer: BLUE CROSS/BLUE SHIELD | Admitting: Physical Therapy

## 2017-01-19 ENCOUNTER — Encounter: Payer: Self-pay | Admitting: Physical Therapy

## 2017-01-19 ENCOUNTER — Encounter: Payer: Self-pay | Admitting: Occupational Therapy

## 2017-01-19 DIAGNOSIS — R262 Difficulty in walking, not elsewhere classified: Secondary | ICD-10-CM

## 2017-01-19 DIAGNOSIS — R278 Other lack of coordination: Secondary | ICD-10-CM

## 2017-01-19 DIAGNOSIS — M6281 Muscle weakness (generalized): Secondary | ICD-10-CM

## 2017-01-19 DIAGNOSIS — R2681 Unsteadiness on feet: Secondary | ICD-10-CM

## 2017-01-19 NOTE — Therapy (Signed)
[facility] MAIN [facility] SERVICES [address] [place], [place], [address] Phone: [phone]   Fax:  [phone]  Physical Therapy Treatment  Patient Details  Name: Joshua [name] MRN: [id number] Date of Birth: 29-Oct-1975 Referring Provider: Merrily Brittle  Encounter Date: 01/19/2017      PT End of Session - 01/19/17 1401    Visit Number 9   Number of Visits 17   Date for PT Re-Evaluation 02/12/17   Authorization Type BCBS   PT Start Time 0145   PT Stop Time 0230   PT Time Calculation (min) 45 min   Equipment Utilized During Treatment Gait belt   Activity Tolerance Patient tolerated treatment well;No increased pain   Behavior During Therapy WFL for tasks assessed/performed      Past Medical History:  Diagnosis Date  . Hypertension   . Scoliosis     History reviewed. No pertinent surgical history.  There were no vitals filed for this visit.      Subjective Assessment - 01/19/17 1400    Subjective Patient is doing well today with no new complaints and is doing HEP.    Patient is accompained by: Family member   Pertinent History Patient describes intermitten 3/10 pain in neck before scoliosis surgery. He reports that he was walking without  AD prior to surgery.  Patient had neck surgery in October of 2017, consisting of 13 screws in anterior neck, due to drastic change in balance and coordination.  Scoliosis surgery performed 11/27/16 with fusion of 13 vertebrae.  Patient denies dizziness, headaches, visual defecits, numbness, and tingling. Patient has 2 drains from abdomen.    Limitations Standing;Walking;Lifting   How long can you sit comfortably? 10 minutes   How long can you stand comfortably? 20 minutes   How long can you walk comfortably? 10 minutes   Patient Stated Goals Walk straight and be independent with ambulation   Currently in Pain? No/denies   Pain Score 0-No pain   Pain Onset More than a month ago   Multiple  Pain Sites No     Treatment  Sit to stand with RW from mat 3x5 with mod cues for sequencing, and not stabilizing on the mat with the back of his LE's Ambulation in clinic x 86ft with cues for keeping LE from crossing  using RW and CGA for safety Standing mini squat 2x10;   SAQ with 3 lbs x 2 x 10 Supine single leg bridge x 10 x 2  Hip flexion march x 20 x 2  bilaterally; with 3 lbs  Hip abduction x20 x 2  bilaterally; LAQ with 3 lbs x 20 x 2 x10 bilaterally; SLR x 120 x 2 x 2 BLE sidelying hip abd x 10 x 2 BLE Sitting clamshell yellow band 2x10 Leg press with 90 lbs 15 x 3 BLE   Pt requires mod verbal and tactile cues for proper exercise performance   pt requires CGA to move between equipment and for transfers.                              PT Education - 01/19/17 1401    Education provided Yes   Education Details exercise technique   Person(s) Educated Patient   Methods Explanation;Demonstration   Comprehension Verbalized understanding;Returned demonstration;Verbal cues required             PT Long Term Goals - 01/14/17 1432  PT LONG TERM GOAL #1   Title Increase BERG score by 7 points to show decreased falls risk with ADLs   Baseline 12/18/16 27/56   Time 8   Period Weeks   Status On-going     PT LONG TERM GOAL #2   Title Increase 10 MWT to 1.0 m/s to improve ability to safely ambulate in community   Baseline 12/18/16 .47 m/s   Time 8   Period Weeks   Status On-going     PT LONG TERM GOAL #3   Title Patient will be independent in home exercise program to improve strength/mobility for better functional independence with ADLs.   Baseline 12/18/16   Time 8   Period Weeks   Status On-going     PT LONG TERM GOAL #4   Title Patient will reduce timed up and go to <11 seconds to reduce fall risk and demonstrate improved transfer/gait ability.   Baseline 12/18/16 not tested due to safety   Time 8   Period Weeks   Status On-going                Plan - 01/19/17 1403    Clinical Impression Statement Patient agreed to increase weight on UE strengthening therex and tolerated it well with minimum-to-no fatigue.  Patient displays good recall of exercises and does not need as much cueing for correct form for scapular retraction and trunk stability.  Patient demonstrates weakness in quads and dorsiflexion lacks appropriate control for proper gait mechanics. Overall, patient is able to navigate the clinic more efficiently and seems to be increasing strength due to adhering to HEP. Patient will continue to benefit from skilled physical therapy to improve balance and gait mechanics to increase independence.   Rehab Potential Good   Clinical Impairments Affecting Rehab Potential Poor balance, weakness in ankle joint, decreased endurance   PT Frequency 2x / week   PT Duration 8 weeks   PT Treatment/Interventions ADLs/Self Care Home Management;Aquatic Therapy;Biofeedback;Electrical Stimulation;Moist Heat;Traction;Contrast Bath;Gait training;Stair training;Functional mobility training;Therapeutic activities;Therapeutic exercise;Balance training;Neuromuscular re-education;Patient/family education;Manual techniques;Passive range of motion;Energy conservation;Splinting;Taping   PT Next Visit Plan Continue strength and ROM therex of upper body and LEs   PT Home Exercise Plan ambulating with walker as much as possible   Consulted and Agree with Plan of Care Patient      Patient will benefit from skilled therapeutic intervention in order to improve the following deficits and impairments:  Abnormal gait, Decreased coordination, Decreased range of motion, Difficulty walking, Decreased endurance, Decreased activity tolerance, Pain, Decreased balance, Hypomobility, Improper body mechanics, Decreased mobility, Decreased strength  Visit Diagnosis: Muscle weakness (generalized)  Other lack of coordination  Difficulty in walking, not elsewhere  classified  Unsteadiness on feet     Problem List There are no active problems to display for this patient.   Jones Broom S,PT DPT 01/19/2017, 2:04 PM  [place] [facility] MAIN [facility] SERVICES [address] [place], [place], [address] Phone: [phone]   Fax:  [phone]  Name: Renault Nile MRN: [id number] Date of Birth: 1975/10/17

## 2017-01-21 ENCOUNTER — Ambulatory Visit: Payer: BLUE CROSS/BLUE SHIELD | Admitting: Physical Therapy

## 2017-01-21 ENCOUNTER — Encounter: Payer: Self-pay | Admitting: Physical Therapy

## 2017-01-21 ENCOUNTER — Ambulatory Visit: Payer: BLUE CROSS/BLUE SHIELD | Admitting: Occupational Therapy

## 2017-01-21 ENCOUNTER — Encounter: Payer: Self-pay | Admitting: Occupational Therapy

## 2017-01-21 DIAGNOSIS — R262 Difficulty in walking, not elsewhere classified: Secondary | ICD-10-CM

## 2017-01-21 DIAGNOSIS — R2681 Unsteadiness on feet: Secondary | ICD-10-CM

## 2017-01-21 DIAGNOSIS — M6281 Muscle weakness (generalized): Secondary | ICD-10-CM

## 2017-01-21 DIAGNOSIS — R278 Other lack of coordination: Secondary | ICD-10-CM

## 2017-01-21 NOTE — Therapy (Signed)
[place] [facility] MAIN [facility] SERVICES [address] [place], [place], [address] Phone: [phone]   Fax:  [phone]  Occupational Therapy Treatment  Patient Details  Name: Joshua [name] MRN: [id number] Date of Birth: 09/07/1975 No Data Recorded  Encounter Date: 01/19/2017      OT End of Session - 01/21/17 1320    Visit Number 9   Number of Visits 24   Date for OT Re-Evaluation 03/10/17   Authorization Type BCBS   OT Start Time 1300   OT Stop Time 1344   OT Time Calculation (min) 44 min   Activity Tolerance Patient tolerated treatment well   Behavior During Therapy [facility] for tasks assessed/performed      Past Medical History:  Diagnosis Date  . Hypertension   . Scoliosis     History reviewed. No pertinent surgical history.  There were no vitals filed for this visit.      Subjective Assessment - 01/21/17 1323    Subjective  Patient reports he didn't do much over the weekend, watched the [place] [employer] on tv.  Would like to work on pinch more.   Pertinent History Patient underwent spinal surgery on June 28 at [facility] and was admitted for 2.5 weeks including inpatient rehabilitation.  He did not receive home health and now presents for outpatient.     Patient Stated Goals Patient reports he would like to walk straighter, try to go back to work and also be able to take care of himself.    Currently in Pain? No/denies   Pain Score 0-No pain                      OT Treatments/Exercises (OP) - 01/21/17 1319      Fine Motor Coordination   Other Fine Motor Exercises Patient seen for fine motor coordination skills with manipulation of 1/2 inch sized objects to place and remove from grid, cues for prehension patterns, translatory skills and using the hand for storage.      Neurological Re-education Exercises   Other Exercises 1 Patient seen for resistive pinch skills for bilateral UE, all levels of pinch pins for  both hands from yellow to black, reaching to place onto elevated surface with 3 point pinch, cues for technique. 2# weight for elbow flexion for 2 sets of 15 repetitions each. 2# for supination/pronation for BUE. Patient seen for bilateral hand strengthening with sustained gripping for 25 repetitions for 25 repetitions on each hand (unable to perform this the first session), then 23# for 10 repetitions each hand.                 OT Education - 01/21/17 1320    Education provided Yes   Education Details patient instructed on coordination exercises.   Person(s) Educated Patient   Methods Explanation;Demonstration;Verbal cues   Comprehension Verbal cues required;Returned demonstration;Verbalized understanding             OT Long Term Goals - 12/20/16 2331      OT LONG TERM GOAL #1   Title Patient will improve B hand function with coordination to be able to tie his shoes independently.    Baseline assist with tying shoes.    Time 12   Period Weeks   Status New     OT LONG TERM GOAL #2   Title Patient will demonstrate donning and doffing shoes with modified independence.    Baseline unable to complete   Time  12   Period Weeks   Status New     OT LONG TERM GOAL #3   Title Patient will complete bathing with modified independence.    Baseline increased assistance at eval   Time 12   Period Weeks   Status New     OT LONG TERM GOAL #4   Title Patient will be independent with home exercise program   Baseline no current program   Time 8   Period Weeks   Status New     OT LONG TERM GOAL #5   Title Patient will complete light homemaking tasks with modified independence.   Baseline unable   Time 12   Period Weeks   Status New     Long Term Additional Goals   Additional Long Term Goals Yes     OT LONG TERM GOAL #6   Title Patient will improve grip strength in left hand by 5# to open jars and containers with modified independence.    Time 12   Period Weeks    Status New               Plan - 01/21/17 1321    Clinical Impression Statement Patient continues to improve with tasks in the clinic, now able to manipulate black pinch pins and increased sustained gripping exercise with 23# for up to 10 repetitions. Patient demonstrates some difficulty with manipulation of small objects 1/2 inch and smaller as well as using the hand for storage and translatory skills of the hand.  Continue to work towards improving these skills to increase independence in daily tasks at home.     Rehab Potential Good   OT Frequency 2x / week   OT Duration 12 weeks   OT Treatment/Interventions Self-care/ADL training;Moist Heat;DME and/or AE instruction;Patient/family education;Therapeutic exercises;Balance training;Therapeutic exercise;Therapeutic activities;Neuromuscular education;Functional Mobility Training;Passive range of motion;Manual Therapy   Consulted and Agree with Plan of Care Patient      Patient will benefit from skilled therapeutic intervention in order to improve the following deficits and impairments:  Decreased coordination, Decreased range of motion, Difficulty walking, Decreased endurance, Decreased activity tolerance, Decreased knowledge of precautions, Decreased balance, Decreased knowledge of use of DME, Impaired UE functional use, Pain, Decreased strength  Visit Diagnosis: Muscle weakness (generalized)  Other lack of coordination    Problem List There are no active problems to display for this patient.  Kerrie Buffalo, OTR/L, CLT  Addison Whidbee 01/21/2017, 1:23 PM  [place] [facility] MAIN [facility] SERVICES [address] [place], [place], [address] Phone: [phone]   Fax:  [phone]  Name: Joshua [name] MRN: [id number] Date of Birth: 1975/07/11

## 2017-01-21 NOTE — Therapy (Signed)
[place] [facility] MAIN [facility] SERVICES [address] [place], [place], [address] Phone: [phone]   Fax:  [phone]  Occupational Therapy Treatment  Patient Details  Name: Joshua [name] MRN: [id number] Date of Birth: [birth year] No Data Recorded  Encounter Date: 01/21/2017      OT End of Session - 01/21/17 1326    Visit Number 10   Number of Visits 24   Date for OT Re-Evaluation 03/10/17   Authorization Type BCBS   OT Start Time 1259   OT Stop Time 1345   OT Time Calculation (min) 46 min   Activity Tolerance Patient tolerated treatment well   Behavior During Therapy [facility] for tasks assessed/performed      Past Medical History:  Diagnosis Date  . Hypertension   . Scoliosis     History reviewed. No pertinent surgical history.  There were no vitals filed for this visit.      Subjective Assessment - 01/21/17 1325    Subjective  Pt reports he stays in his room alot, watches TV and writes.  Likes to also play playstation at times.    Pertinent History Patient underwent spinal surgery on June 28 at [facility] and was admitted for 2.5 weeks including inpatient rehabilitation.  He did not receive home health and now presents for outpatient.     Patient Stated Goals Patient reports he would like to walk straighter, try to go back to work and also be able to take care of himself.    Currently in Pain? No/denies   Pain Score 0-No pain                      OT Treatments/Exercises (OP) - 01/21/17 1335      Fine Motor Coordination   Other Fine Motor Exercises Patient seen for manipulation of nuts/bolts with use of left hand to lead task, cues for technique, working on thumb finger combinations and speed of task as well.      Neurological Re-education Exercises   Other Exercises 1 Patient seen for bilateral UE strengthening tasks with 1# dowel for shoulder flexion, chest press, forwards and backwards circles and ABD/ADD for 15 reps  for 2 sets. Finger strengthening on the left with moderate resistive push pins into large bulletin board with cues for 10 repetitions and then switched to right hand for 10.                  OT Education - 01/21/17 1326    Education provided Yes   Education Details manipulation of small objects, nuts/bolts   Person(s) Educated Patient   Methods Explanation;Demonstration;Verbal cues   Comprehension Verbal cues required;Returned demonstration;Verbalized understanding             OT Long Term Goals - 12/20/16 2331      OT LONG TERM GOAL #1   Title Patient will improve B hand function with coordination to be able to tie his shoes independently.    Baseline assist with tying shoes.    Time 12   Period Weeks   Status New     OT LONG TERM GOAL #2   Title Patient will demonstrate donning and doffing shoes with modified independence.    Baseline unable to complete   Time 12   Period Weeks   Status New     OT LONG TERM GOAL #3   Title Patient will complete bathing with modified independence.    Baseline increased assistance at eval  Time 12   Period Weeks   Status New     OT LONG TERM GOAL #4   Title Patient will be independent with home exercise program   Baseline no current program   Time 8   Period Weeks   Status New     OT LONG TERM GOAL #5   Title Patient will complete light homemaking tasks with modified independence.   Baseline unable   Time 12   Period Weeks   Status New     Long Term Additional Goals   Additional Long Term Goals Yes     OT LONG TERM GOAL #6   Title Patient will improve grip strength in left hand by 5# to open jars and containers with modified independence.    Time 12   Period Weeks   Status New               Plan - 01/21/17 1326    Clinical Impression Statement Patient continues to progress and work towards increased strength and coordination in UEs.  Patient has some difficulty with picking up plates in the kitchen,  pouring liquids from a small pitcher.  Patient demonstrates some mild difficulty with managing push pins onto bulletin board with the left for finger strength.  Continue to work towards goals to improve functional use of UE in necessary daily tasks.     Rehab Potential Good   OT Frequency 1x / week   OT Duration 12 weeks   OT Treatment/Interventions Self-care/ADL training;Moist Heat;DME and/or AE instruction;Patient/family education;Therapeutic exercises;Balance training;Therapeutic exercise;Therapeutic activities;Neuromuscular education;Functional Mobility Training;Passive range of motion;Manual Therapy   Consulted and Agree with Plan of Care Patient      Patient will benefit from skilled therapeutic intervention in order to improve the following deficits and impairments:  Decreased coordination, Decreased range of motion, Difficulty walking, Decreased endurance, Decreased activity tolerance, Decreased knowledge of precautions, Decreased balance, Decreased knowledge of use of DME, Impaired UE functional use, Pain, Decreased strength  Visit Diagnosis: Muscle weakness (generalized)  Other lack of coordination    Problem List There are no active problems to display for this patient.  Kerrie Buffalo, OTR/L, CLT  Havilah Topor 01/21/2017, 1:47 PM  [place] [facility] MAIN [facility] SERVICES [address] [place], [place], [address] Phone: [phone]   Fax:  [phone]  Name: Joshua [name] MRN: [id number] Date of Birth: January 08, 1976

## 2017-01-21 NOTE — Therapy (Signed)
[place] [facility] MAIN [facility] SERVICES [address] [place], [place], [address] Phone: [phone]   Fax:  [phone]  Physical Therapy Treatment  Patient Details  Name: Joshua [name] MRN: [id number] Date of Birth: 1975-06-21 Referring Provider: Merrily Brittle  Encounter Date: 01/21/2017      PT End of Session - 01/21/17 1351    Visit Number 10   Number of Visits 17   Date for PT Re-Evaluation 02/12/17   Authorization Type BCBS check goals every 30 days. Eval 12/18/16. Re-check 01/21/17   PT Start Time 1345   PT Stop Time 1430   PT Time Calculation (min) 45 min   Equipment Utilized During Treatment Gait belt   Activity Tolerance Patient tolerated treatment well;No increased pain   Behavior During Therapy WFL for tasks assessed/performed      Past Medical History:  Diagnosis Date  . Hypertension   . Scoliosis     History reviewed. No pertinent surgical history.  There were no vitals filed for this visit.      Subjective Assessment - 01/21/17 1350    Subjective Patient states feeling good today and has no new complaints.   Pertinent History Patient describes intermitten 3/10 pain in neck before scoliosis surgery. He reports that he was walking without  AD prior to surgery.  Patient had neck surgery in October of 2017, consisting of 13 screws in anterior neck, due to drastic change in balance and coordination.  Scoliosis surgery performed 11/27/16 with fusion of 13 vertebrae.  Patient denies dizziness, headaches, visual defecits, numbness, and tingling. Patient has 2 drains from abdomen.    Limitations Standing;Walking;Lifting   How long can you sit comfortably? 10 minutes   How long can you stand comfortably? 20 minutes   How long can you walk comfortably? 10 minutes   Patient Stated Goals Walk straight and be independent with ambulation   Currently in Pain? No/denies   Pain Score 0-No pain   Multiple Pain Sites No         Treatment:  Nu Step warm up 5 minutes   BERG, 10 MWT, and TUG performed  Ther-ex HS stretch with strap 3 x 30 seconds each. Patient has increased tone in HS that decreased with stretch.  SLR 2 x 8. Patient demonstrates knee lag bilaterally.   Single leg bridge 10 x each. Patient instructed with pushing through his heels and to focus on contracting glute muscles; patient avoided lower back pain during this exercise. Good fatigue at end of set.  LAQ plus B shoulder flexion with 9 lbs on ankles 2 x 1 minute isometric holds. Patient is able to perform exercise with good trunk control; limited but equal shoulder flexion bilaterally observed here.  Clams with BTB around knees 15 x 5 seconds each.  Patient is able to correctly activate glute med; patient lacks full ROM with BTB. Good fatigue here  Sit to stand from 23 inches (high-low table) 15x. Patient requires heavy cueing for proper forward trunk lean to keep weight distributed over his feet. Patient is able to perform independently 50% of the time. Patient displays compensation with pressing against the table with the back of his thighs for extra stability.                     PT Education - 01/21/17 1351    Education provided Yes   Education Details Progress shown with outcome measures and continuing to challenge B LE strength and balance  Person(s) Educated Patient   Methods Explanation   Comprehension Verbalized understanding             PT Long Term Goals - 01/21/17 1354      PT LONG TERM GOAL #1   Title Increase BERG score by 7 points to show decreased falls risk with ADLs   Baseline 12/18/16 27/56 01/21/17: 37/56   Time 8   Period Weeks   Status On-going     PT LONG TERM GOAL #2   Title Increase 10 MWT to 1.0 m/s to improve ability to safely ambulate in community   Baseline 12/18/16 .47 m/s 01/21/17: .74 m/s with walker   Time 8   Period Weeks   Status On-going     PT LONG TERM GOAL #3    Title Patient will be independent in home exercise program to improve strength/mobility for better functional independence with ADLs.   Baseline 12/18/16   Time 8   Period Weeks   Status On-going     PT LONG TERM GOAL #4   Title Patient will reduce timed up and go to <11 seconds to reduce fall risk and demonstrate improved transfer/gait ability.   Baseline 12/18/16 not tested due to safety 01/21/17: 20.26 seconds with walker   Time 8   Period Weeks   Status On-going               Plan - 01/21/17 1353    Clinical Impression Statement Today marks 30 days post-evaluation so outcome measures were performed to track progress.  Patient improved BERG score by 10 points, increased his gait speed by .5 m/s and was able to perform the TUG test independently. Overall, patient demonstrates good improvement in therapy and shows ability to independently negotiate transfers around the clinic with better efficiency.  Patient needs improvement with single leg balance, B LE strength and coordination of B LE to be able to ambulate without assistive device. Patient will continue to benefit from skilled physical therapy to increase B LE strenght and coordination increase independence.   Rehab Potential Good   Clinical Impairments Affecting Rehab Potential Poor balance, weakness in ankle joint, decreased endurance   PT Frequency 2x / week   PT Duration 8 weeks   PT Treatment/Interventions ADLs/Self Care Home Management;Aquatic Therapy;Biofeedback;Electrical Stimulation;Moist Heat;Traction;Contrast Bath;Gait training;Stair training;Functional mobility training;Therapeutic activities;Therapeutic exercise;Balance training;Neuromuscular re-education;Patient/family education;Manual techniques;Passive range of motion;Energy conservation;Splinting;Taping   PT Next Visit Plan Continue strength and ROM therex of upper body and LEs   PT Home Exercise Plan ambulating with walker as much as possible   Consulted and Agree  with Plan of Care Patient      Patient will benefit from skilled therapeutic intervention in order to improve the following deficits and impairments:  Abnormal gait, Decreased coordination, Decreased range of motion, Difficulty walking, Decreased endurance, Decreased activity tolerance, Pain, Decreased balance, Hypomobility, Improper body mechanics, Decreased mobility, Decreased strength  Visit Diagnosis: Muscle weakness (generalized)  Other lack of coordination  Difficulty in walking, not elsewhere classified  Unsteadiness on feet     Problem List There are no active problems to display for this patient. This entire session was performed under direct supervision and direction of a licensed therapist/therapist assistant . I have personally read, edited and approve of the note as written. [facility] [address] [place], [place] DPT 01/21/2017, 3:47 PM  [place] [facility] MAIN [facility] SERVICES [address] [place], [place], [address] Phone: [phone]   Fax:  [phone]  Name: Joshua [name] MRN: [id number] Date of Birth: 1975/11/24

## 2017-01-26 ENCOUNTER — Encounter: Payer: Self-pay | Admitting: Physical Therapy

## 2017-01-26 ENCOUNTER — Ambulatory Visit: Payer: BLUE CROSS/BLUE SHIELD | Admitting: Occupational Therapy

## 2017-01-26 ENCOUNTER — Ambulatory Visit: Payer: BLUE CROSS/BLUE SHIELD | Admitting: Physical Therapy

## 2017-01-26 DIAGNOSIS — M6281 Muscle weakness (generalized): Secondary | ICD-10-CM

## 2017-01-26 DIAGNOSIS — R278 Other lack of coordination: Secondary | ICD-10-CM

## 2017-01-26 DIAGNOSIS — R262 Difficulty in walking, not elsewhere classified: Secondary | ICD-10-CM

## 2017-01-26 DIAGNOSIS — R2681 Unsteadiness on feet: Secondary | ICD-10-CM

## 2017-01-26 NOTE — Therapy (Signed)
[place] [facility] MAIN [facility] SERVICES [address] [place], [place], [address] Phone: [phone]   Fax:  [phone]  Occupational Therapy Treatment  Patient Details  Name: Joshua [name] MRN: [id number] Date of Birth: [birth year] No Data Recorded  Encounter Date: 01/26/2017      OT End of Session - 01/26/17 1315    Visit Number 11   Number of Visits 24   Date for OT Re-Evaluation 03/10/17   Authorization Type BCBS   OT Start Time 1303   OT Stop Time 1345   OT Time Calculation (min) 42 min   Activity Tolerance Patient tolerated treatment well   Behavior During Therapy [facility] for tasks assessed/performed      Past Medical History:  Diagnosis Date  . Hypertension   . Scoliosis     No past surgical history on file.  There were no vitals filed for this visit.      Subjective Assessment - 01/26/17 1307    Subjective  Pt. reports he is using his arms more at home.   Pertinent History Patient underwent spinal surgery on June 28 at [facility] and was admitted for 2.5 weeks including inpatient rehabilitation.  He did not receive home health and now presents for outpatient.     Patient Stated Goals Patient reports he would like to walk straighter, try to go back to work and also be able to take care of himself.    Currently in Pain? No/denies      OT TREATMENT     Neuro muscular re-education:  Pt. Worked on grasping coins from a tabletop surface, placing them into a resistive container, and pushing them through the slot while isolating his 2nd digit. Pt. worked on grasping 1" resistive cubes alternating thumb opposition to the tip of the 2nd through 5th digits while the board is placed at a vertical angle. Pt. worked on pressing the cubes back into place while alternating isolated 2nd through 5th digit extension.  Therapeutic Exercise:  Pt. performed 2# dowel ex. For UE strengthening secondary to weakness. Bilateral shoulder flexion, chest  press, circular patterns, and elbow flexion/extension were performed. 2# dumbbell ex. for elbow flexion and extension,  2# for forearm supination/pronation, wrist flexion/extension, and radial deviation. Pt. requires rest breaks and verbal cues for proper technique. Pt. Worked on pinch strengthening in the left hand for lateral, and 3pt. pinch using red, green, and blue resistive clips. Pt. worked on placing the clips at various vertical and horizontal angles. Tactile and verbal cues were required for eliciting the desired movement.                             OT Education - 01/26/17 1311    Education provided Yes   Education Details LUE functioning   Person(s) Educated Patient   Methods Explanation   Comprehension Verbalized understanding             OT Long Term Goals - 12/20/16 2331      OT LONG TERM GOAL #1   Title Patient will improve B hand function with coordination to be able to tie his shoes independently.    Baseline assist with tying shoes.    Time 12   Period Weeks   Status New     OT LONG TERM GOAL #2   Title Patient will demonstrate donning and doffing shoes with modified independence.    Baseline unable to complete   Time  12   Period Weeks   Status New     OT LONG TERM GOAL #3   Title Patient will complete bathing with modified independence.    Baseline increased assistance at eval   Time 12   Period Weeks   Status New     OT LONG TERM GOAL #4   Title Patient will be independent with home exercise program   Baseline no current program   Time 8   Period Weeks   Status New     OT LONG TERM GOAL #5   Title Patient will complete light homemaking tasks with modified independence.   Baseline unable   Time 12   Period Weeks   Status New     Long Term Additional Goals   Additional Long Term Goals Yes     OT LONG TERM GOAL #6   Title Patient will improve grip strength in left hand by 5# to open jars and containers with modified  independence.    Time 12   Period Weeks   Status New               Plan - 01/26/17 1315    Clinical Impression Statement Pt. reports he is using his arms more at home during ADL, and self-care tasks. Pt. continues to present with LUE weakness, and incoordination. Pt. continues to work on improving LUE strength, and coordination skills for improved use during ADLs, and IADLs.    Occupational performance deficits (Please refer to evaluation for details): ADL's;IADL's   Rehab Potential Good   OT Frequency 1x / week   OT Duration 12 weeks   OT Treatment/Interventions Self-care/ADL training;Moist Heat;DME and/or AE instruction;Patient/family education;Therapeutic exercises;Balance training;Therapeutic exercise;Therapeutic activities;Neuromuscular education;Functional Mobility Training;Passive range of motion;Manual Therapy   Consulted and Agree with Plan of Care Patient      Patient will benefit from skilled therapeutic intervention in order to improve the following deficits and impairments:  Decreased coordination, Decreased range of motion, Difficulty walking, Decreased endurance, Decreased activity tolerance, Decreased knowledge of precautions, Decreased balance, Decreased knowledge of use of DME, Impaired UE functional use, Pain, Decreased strength  Visit Diagnosis: Muscle weakness (generalized)  Other lack of coordination    Problem List There are no active problems to display for this patient.   [name] 01/26/2017, 3:28 PM  [place] [facility] MAIN [facility] SERVICES [address] [place], [place], [address] Phone: [phone]   Fax:  [phone]  Name: Forest Redwine MRN: [id number] Date of Birth: [birth year]

## 2017-01-26 NOTE — Therapy (Signed)
[place] [facility] MAIN [facility] SERVICES [address] [place], [place], [address] Phone: [phone]   Fax:  [phone]  Physical Therapy Treatment  Patient Details  Name: Joshua [name] MRN: [id number] Date of Birth: 05-08-1976 Referring Provider: Merrily Brittle  Encounter Date: 01/26/2017      PT End of Session - 01/26/17 1356    Visit Number 11   Number of Visits 17   Date for PT Re-Evaluation 02/12/17   Authorization Type BCBS check goals every 30 days. Eval 12/18/16. Re-check 01/21/17   PT Start Time 0145   PT Stop Time 0230   PT Time Calculation (min) 45 min   Equipment Utilized During Treatment Gait belt   Activity Tolerance Patient tolerated treatment well;No increased pain   Behavior During Therapy [facility] for tasks assessed/performed      Past Medical History:  Diagnosis Date  . Hypertension   . Scoliosis     History reviewed. No pertinent surgical history.  There were no vitals filed for this visit.      Subjective Assessment - 01/26/17 1356    Subjective Patient states feeling good today and has no new complaints.   Patient is accompained by: Family member   Pertinent History Patient describes intermitten 3/10 pain in neck before scoliosis surgery. He reports that he was walking without  AD prior to surgery.  Patient had neck surgery in October of 2017, consisting of 13 screws in anterior neck, due to drastic change in balance and coordination.  Scoliosis surgery performed 11/27/16 with fusion of 13 vertebrae.  Patient denies dizziness, headaches, visual defecits, numbness, and tingling. Patient has 2 drains from abdomen.    Limitations Standing;Walking;Lifting   How long can you sit comfortably? 10 minutes   How long can you stand comfortably? 20 minutes   How long can you walk comfortably? 10 minutes   Patient Stated Goals Walk straight and be independent with ambulation   Currently in Pain? No/denies   Pain Score 0-No pain    Pain Onset More than a month ago   Multiple Pain Sites No      Treatment: BLE: HS stretch with strap 2 reps x 1 minute.   Piriformis stretch 1 minute each. Patient shows good mobility with trunk stability here  Hip flexor stretch thomas position 1 minutes each. Patient's thigh is unable to touch the table due to tightness; good stretch here.   Single leg bridge x 15 x 2   Leg press 90 lbs 2 x 10. Patient requires cueing to avoid L knee hyperextension.   Seated row on stool 22.5 lbs 3 sets x 8 reps. Patient responds well to tactile cueing for proper scapular retraction  Lat pull down on stool 22.5 lbs 2 sets x 8. Reps  Scaption on stool 2 lbs 3 sets x 8 reps. Patient needs minimal cueing for upright posture; good   Ambulation around clinic 100 feet x 2  Patient instructed to exaggerate stepping pattern to reinforce foot clearing ability and demonstrate proper heel strike.  Patient displays occasional impaired coordination with feet with decreased motor control and BLE adduction.                            PT Education - 01/26/17 1356    Education provided Yes   Education Details HEP   Person(s) Educated Patient   Methods Explanation   Comprehension Verbalized understanding  PT Long Term Goals - 01/26/17 1358      PT LONG TERM GOAL #1   Title Increase BERG score by 7 points to show decreased falls risk with ADLs   Baseline 12/18/16 27/56 01/21/17: 37/56   Time 8   Period Weeks   Status On-going     PT LONG TERM GOAL #2   Title Increase 10 MWT to 1.0 m/s to improve ability to safely ambulate in community   Baseline 12/18/16 .47 m/s 01/21/17: .74 m/s with walker,   Time 8   Period Weeks   Status On-going     PT LONG TERM GOAL #3   Title Patient will be independent in home exercise program to improve strength/mobility for better functional independence with ADLs.   Baseline , Patient is performing HEP    Time 8   Period Weeks    Status On-going     PT LONG TERM GOAL #4   Title Patient will reduce timed up and go to <11 seconds to reduce fall risk and demonstrate improved transfer/gait ability.   Baseline 12/18/16 not tested due to safety 01/21/17: 20.26 seconds with walker, 8/27 23.43   Time 8   Period Weeks   Status On-going               Plan - 01/26/17 1412    Clinical Impression Statement Focused on advancement of sitting exercises and performing more exercises in standing today to improve LE strength and improving endurance. Patient demonstrates increased postural sway and LOB posteriorly requiring UEs to maintain balance and VCs to shift weight forward. Patient will benefit from further skilled therapy focused on improving strength and balance to return to prior level of function   Rehab Potential Good   Clinical Impairments Affecting Rehab Potential Poor balance, weakness in ankle joint, decreased endurance   PT Frequency 2x / week   PT Duration 8 weeks   PT Treatment/Interventions ADLs/Self Care Home Management;Aquatic Therapy;Biofeedback;Electrical Stimulation;Moist Heat;Traction;Contrast Bath;Gait training;Stair training;Functional mobility training;Therapeutic activities;Therapeutic exercise;Balance training;Neuromuscular re-education;Patient/family education;Manual techniques;Passive range of motion;Energy conservation;Splinting;Taping   PT Next Visit Plan Continue strength and ROM therex of upper body and LEs   PT Home Exercise Plan ambulating with walker as much as possible   Consulted and Agree with Plan of Care Patient      Patient will benefit from skilled therapeutic intervention in order to improve the following deficits and impairments:  Abnormal gait, Decreased coordination, Decreased range of motion, Difficulty walking, Decreased endurance, Decreased activity tolerance, Pain, Decreased balance, Hypomobility, Improper body mechanics, Decreased mobility, Decreased strength  Visit  Diagnosis: Muscle weakness (generalized)  Other lack of coordination  Difficulty in walking, not elsewhere classified  Unsteadiness on feet     Problem List There are no active problems to display for this patient.   [address] [place] DPT 01/26/2017, 2:14 PM  [place] [facility] MAIN [facility] SERVICES [address] [place], [place], [address] Phone: [phone]   Fax:  [phone]  Name: Joshua [name] MRN: [id number] Date of Birth: 1976/02/01

## 2017-01-28 ENCOUNTER — Ambulatory Visit: Payer: BLUE CROSS/BLUE SHIELD | Admitting: Physical Therapy

## 2017-01-28 ENCOUNTER — Encounter: Payer: Self-pay | Admitting: Physical Therapy

## 2017-01-28 ENCOUNTER — Ambulatory Visit: Payer: BLUE CROSS/BLUE SHIELD | Admitting: Occupational Therapy

## 2017-01-28 DIAGNOSIS — R2681 Unsteadiness on feet: Secondary | ICD-10-CM

## 2017-01-28 DIAGNOSIS — R278 Other lack of coordination: Secondary | ICD-10-CM

## 2017-01-28 DIAGNOSIS — M6281 Muscle weakness (generalized): Secondary | ICD-10-CM

## 2017-01-28 DIAGNOSIS — R262 Difficulty in walking, not elsewhere classified: Secondary | ICD-10-CM

## 2017-01-28 NOTE — Therapy (Signed)
[place] [facility] MAIN [facility] SERVICES [address] [place], [place], [address] Phone: [phone]   Fax:  [phone]  Occupational Therapy Treatment  Patient Details  Name: Joshua [name] MRN: [id number] Date of Birth: [birth year] No Data Recorded  Encounter Date: 01/28/2017      OT End of Session - 01/28/17 1317    Visit Number 12   Number of Visits 24   Date for OT Re-Evaluation 03/10/17   Authorization Type BCBS   OT Start Time 1302   OT Stop Time 1345   OT Time Calculation (min) 43 min   Activity Tolerance Patient tolerated treatment well   Behavior During Therapy [facility] for tasks assessed/performed      Past Medical History:  Diagnosis Date  . Hypertension   . Scoliosis     No past surgical history on file.  There were no vitals filed for this visit.      Subjective Assessment - 01/28/17 1310    Subjective  pt. reports he likes to go to parks, and take in the scenery.   Pertinent History Patient underwent spinal surgery on June 28 at [facility] and was admitted for 2.5 weeks including inpatient rehabilitation.  He did not receive home health and now presents for outpatient.     Patient Stated Goals Patient reports he would like to walk straighter, try to go back to work and also be able to take care of himself.    Currently in Pain? No/denies      OT TREATMENT    Neuro muscular re-education:  Pt. performed resistive EZ Board exercises grasping rectangle blocks with thumb and 2nd and 3rd digits. The board was placed at the tabletop, as well as at a vertical angle. Pt. worked on grasping 1" resistive cubes alternating thumb opposition to the tip of the 2nd through 5th digits while the board is placed at a vertical angle. Pt. worked on pressing the cubes back into place while alternating isolated 2nd through 5th digit extension.   Therapeutic Exercise:  Pt. performed 2# dowel ex. For UE strengthening secondary to weakness.  Bilateral shoulder flexion, chest press, circular patterns, and elbow flexion/extension were performed. 3# dumbbell ex. for elbow flexion and extension, forearm supination/pronation, wrist flexion/extension, and radial deviation. Pt. requires rest breaks and verbal cues for proper technique. Pt. Worked on pinch strengthening in the left hand for lateral, and 3pt. pinch using yellow, red, green, blue, and black resistive clips. Pt. worked on placing the clips at various vertical and horizontal angles. Tactile and verbal cues were required for eliciting the desired movement.Pt. performed resistive EZ Board exercises grasping rectangle blocks with thumb and 2nd and 3rd digits. The board was placed at the tabletop, as well as at a vertical angle.                              OT Education - 01/28/17 1314    Education provided Yes   Education Details UE strength, pinch strength, and coordination   Person(s) Educated Patient   Methods Explanation   Comprehension Verbalized understanding             OT Long Term Goals - 12/20/16 2331      OT LONG TERM GOAL #1   Title Patient will improve B hand function with coordination to be able to tie his shoes independently.    Baseline assist with tying shoes.    Time 12  Period Weeks   Status New     OT LONG TERM GOAL #2   Title Patient will demonstrate donning and doffing shoes with modified independence.    Baseline unable to complete   Time 12   Period Weeks   Status New     OT LONG TERM GOAL #3   Title Patient will complete bathing with modified independence.    Baseline increased assistance at eval   Time 12   Period Weeks   Status New     OT LONG TERM GOAL #4   Title Patient will be independent with home exercise program   Baseline no current program   Time 8   Period Weeks   Status New     OT LONG TERM GOAL #5   Title Patient will complete light homemaking tasks with modified independence.   Baseline  unable   Time 12   Period Weeks   Status New     Long Term Additional Goals   Additional Long Term Goals Yes     OT LONG TERM GOAL #6   Title Patient will improve grip strength in left hand by 5# to open jars and containers with modified independence.    Time 12   Period Weeks   Status New               Plan - 01/28/17 1317    Clinical Impression Statement Pt. reports he is feeling better overall. Pt. reports he is now picking things more at home using both of his hands. Pt. continues to work on improving UE strength, and coordination skills during ADLS, and IADLs.   Occupational performance deficits (Please refer to evaluation for details): ADL's;IADL's   Rehab Potential Good   OT Frequency 1x / week   OT Duration 12 weeks   OT Treatment/Interventions Self-care/ADL training;Moist Heat;DME and/or AE instruction;Patient/family education;Therapeutic exercises;Balance training;Therapeutic exercise;Therapeutic activities;Neuromuscular education;Functional Mobility Training;Passive range of motion;Manual Therapy   Consulted and Agree with Plan of Care Patient      Patient will benefit from skilled therapeutic intervention in order to improve the following deficits and impairments:  Decreased coordination, Decreased range of motion, Difficulty walking, Decreased endurance, Decreased activity tolerance, Decreased knowledge of precautions, Decreased balance, Decreased knowledge of use of DME, Impaired UE functional use, Pain, Decreased strength  Visit Diagnosis: Muscle weakness (generalized)  Other lack of coordination    Problem List There are no active problems to display for this patient.   [name], MS, OTR/L 01/28/2017, 1:34 PM  [place] [facility] MAIN [facility] SERVICES [address] [place], [place], [address] Phone: [phone]   Fax:  [phone]  Name: Joshua [name] MRN: [id number] Date of Birth: 08/05/1975

## 2017-01-28 NOTE — Therapy (Addendum)
[facility] MAIN [facility] SERVICES [address] [place], [place], [address] Phone: [phone]   Fax:  [phone]  Physical Therapy Treatment  Patient Details  Name: Joshua [name] MRN: [id number] Date of Birth: December 11, 1975 Referring Provider: Merrily Brittle  Encounter Date: 01/28/2017      PT End of Session - 01/28/17 1343    Visit Number 12   Number of Visits 17   Date for PT Re-Evaluation 02/12/17   Authorization Type BCBS check goals every 30 days. Eval 12/18/16. Re-check 01/21/17   PT Start Time 0145   PT Stop Time 0230   PT Time Calculation (min) 45 min   Equipment Utilized During Treatment Gait belt   Activity Tolerance Patient tolerated treatment well;No increased pain   Behavior During Therapy WFL for tasks assessed/performed      Past Medical History:  Diagnosis Date  . Hypertension   . Scoliosis     History reviewed. No pertinent surgical history.  There were no vitals filed for this visit.      Subjective Assessment - 01/28/17 1642    Subjective Patient states feeling good today and has no new complaints.   Patient is accompained by: Family member   Pertinent History Patient describes intermitten 3/10 pain in neck before scoliosis surgery. He reports that he was walking without  AD prior to surgery.  Patient had neck surgery in October of 2017, consisting of 13 screws in anterior neck, due to drastic change in balance and coordination.  Scoliosis surgery performed 11/27/16 with fusion of 13 vertebrae.  Patient denies dizziness, headaches, visual defecits, numbness, and tingling. Patient has 2 drains from abdomen.    Limitations Standing;Walking;Lifting   How long can you sit comfortably? 10 minutes   How long can you stand comfortably? 20 minutes   How long can you walk comfortably? 10 minutes   Patient Stated Goals Walk straight and be independent with ambulation   Currently in Pain? No/denies   Pain Score 0-No pain    Pain Onset More than a month ago   Multiple Pain Sites No      Treatment:  Octane fitness: warm up 5 minutes. no pain or discomfort.  TM at . 5 miles / hour and 3 minutes, patient has increased tone with second attempt to perform TM  Seated rows on stool 3 x 10 17.5 lbs. Moderate cueing for upright posture and depressing of shoulders  Seated lat pull downs on stool 3 x 10 17.5 lbs. Patient cued to engage lats and scapular retractors without upper trap recruitment.   Seated scaption on stool 3 x 10 1 lb dumbbell. Patient shows good scap stabilizing ability   SAQ x 4 lbs x 20 , cues for full ROM  LAQ 20 x 5 seconds with 3 lbs.  Occasional shaking of R leg with increased demand that resolves with rest.  HS stretch  3 x 30 seconds. Mild decrease in tone and improvement in ROM towards end of repetitions  Gait training with RW on level surfaces 300 feet x 2 and CGA with ataxic LE movements and decreased coordination of LE's .   Patient tolerated therapy well today and did not require rest breaks.   HEP added hooklying marching, sit to stand with good control.                            PT Education - 01/28/17 1343    Education  Details HEP progression   Person(s) Educated Patient   Methods Explanation;Demonstration   Comprehension Verbalized understanding;Returned demonstration             PT Long Term Goals - 01/26/17 1358      PT LONG TERM GOAL #1   Title Increase BERG score by 7 points to show decreased falls risk with ADLs   Baseline 12/18/16 27/56 01/21/17: 37/56   Time 8   Period Weeks   Status On-going     PT LONG TERM GOAL #2   Title Increase 10 MWT to 1.0 m/s to improve ability to safely ambulate in community   Baseline 12/18/16 .47 m/s 01/21/17: .74 m/s with walker,   Time 8   Period Weeks   Status On-going     PT LONG TERM GOAL #3   Title Patient will be independent in home exercise program to improve strength/mobility for better  functional independence with ADLs.   Baseline , Patient is performing HEP    Time 8   Period Weeks   Status On-going     PT LONG TERM GOAL #4   Title Patient will reduce timed up and go to <11 seconds to reduce fall risk and demonstrate improved transfer/gait ability.   Baseline 12/18/16 not tested due to safety 01/21/17: 20.26 seconds with walker, 8/27 23.43   Time 8   Period Weeks   Status On-going               Plan - 01/28/17 1344    Clinical Impression Statement Patient agreed to increase weight on UE strengthening therex and tolerated it well with minimum-to-no fatigue.  Patient displays good recall of exercises and does not need as much cueing for correct form for scapular retraction and trunk stability.  Patient demonstrates weakness in quads and dorsiflexion lacks appropriate control for proper gait mechanics. Overall, patient is able to navigate the clinic more efficiently and seems to be increasing strength due to adhering to HEP. Patient will continue to benefit from skilled physical therapy to improve balance and gait mechanics to increase independence   Clinical Decision Making Moderate   Rehab Potential Good   Clinical Impairments Affecting Rehab Potential Poor balance, weakness in ankle joint, decreased endurance   PT Frequency 2x / week   PT Duration 8 weeks   PT Treatment/Interventions ADLs/Self Care Home Management;Aquatic Therapy;Biofeedback;Electrical Stimulation;Moist Heat;Traction;Contrast Bath;Gait training;Stair training;Functional mobility training;Therapeutic activities;Therapeutic exercise;Balance training;Neuromuscular re-education;Patient/family education;Manual techniques;Passive range of motion;Energy conservation;Splinting;Taping   PT Next Visit Plan Continue strength and ROM therex of upper body and LEs   PT Home Exercise Plan ambulating with walker as much as possible   Consulted and Agree with Plan of Care Patient      Patient will benefit from  skilled therapeutic intervention in order to improve the following deficits and impairments:  Abnormal gait, Decreased coordination, Decreased range of motion, Difficulty walking, Decreased endurance, Decreased activity tolerance, Pain, Decreased balance, Hypomobility, Improper body mechanics, Decreased mobility, Decreased strength  Visit Diagnosis: Muscle weakness (generalized)  Other lack of coordination  Difficulty in walking, not elsewhere classified  Unsteadiness on feet     Problem List There are no active problems to display for this patient.   Ezekiel InaMansfield, Merril Nagy S, [place] DPT 01/29/2017, 4:44 PM  [facility] [address] [place], [place], [address] Phone: [phone]   Fax:  [phone]  Name: Joshua [name] MRN: [id number] Date of Birth: 1976/04/01

## 2017-02-04 ENCOUNTER — Ambulatory Visit: Payer: BLUE CROSS/BLUE SHIELD | Admitting: Physical Therapy

## 2017-02-04 ENCOUNTER — Ambulatory Visit: Payer: BLUE CROSS/BLUE SHIELD | Attending: Physical Medicine and Rehabilitation | Admitting: Occupational Therapy

## 2017-02-04 ENCOUNTER — Encounter: Payer: Self-pay | Admitting: Physical Therapy

## 2017-02-04 DIAGNOSIS — R2681 Unsteadiness on feet: Secondary | ICD-10-CM | POA: Diagnosis present

## 2017-02-04 DIAGNOSIS — R262 Difficulty in walking, not elsewhere classified: Secondary | ICD-10-CM | POA: Insufficient documentation

## 2017-02-04 DIAGNOSIS — R278 Other lack of coordination: Secondary | ICD-10-CM | POA: Diagnosis present

## 2017-02-04 DIAGNOSIS — M6281 Muscle weakness (generalized): Secondary | ICD-10-CM

## 2017-02-04 NOTE — Therapy (Signed)
[facility] [address] [place], [place], [address] Phone: [phone]   Fax:  [phone]  Physical Therapy Treatment  Patient Details  Name: Joshua HotterJames [name] MRN: [id number] Date of Birth: 15-Mar-1976 Referring Provider: Merrily BrittleAUCH, KIMBERLY KARRAT  Encounter Date: 02/04/2017      PT End of Session - 02/04/17 1438    Visit Number 13   Number of Visits 17   Date for PT Re-Evaluation 02/12/17   Authorization Type BCBS check goals every 30 days. Eval 12/18/16. Re-check 01/21/17   PT Start Time 0146   PT Stop Time 0232   PT Time Calculation (min) 46 min   Equipment Utilized During Treatment Gait belt   Activity Tolerance Patient tolerated treatment well;No increased pain   Behavior During Therapy WFL for tasks assessed/performed      Past Medical History:  Diagnosis Date  . Hypertension   . Scoliosis     History reviewed. No pertinent surgical history.  There were no vitals filed for this visit.      Subjective Assessment - 02/04/17 1408    Subjective Patient states feeling good today and has no new complaints. He tired to walk at his home without Ad but stopped after 5 feet due to feeling like he might fall.    Patient is accompained by: Family member   Pertinent History Patient describes intermitten 3/10 pain in neck before scoliosis surgery. He reports that he was walking without  AD prior to surgery.  Patient had neck surgery in October of 2017, consisting of 13 screws in anterior neck, due to drastic change in balance and coordination.  Scoliosis surgery performed 11/27/16 with fusion of 13 vertebrae.  Patient denies dizziness, headaches, visual defecits, numbness, and tingling. Patient has 2 drains from abdomen.    Limitations Standing;Walking;Lifting   How long can you sit comfortably? 10 minutes   How long can you stand comfortably? 20 minutes   How long can you walk comfortably? 10 minutes   Patient Stated Goals Walk  straight and be independent with ambulation   Currently in Pain? No/denies   Pain Score 0-No pain   Pain Onset More than a month ago   Multiple Pain Sites No     Treatment: Sit to stand from mat to stand without using LE to stabilize, with cues for not going too fast and cues for using hands for push off x 10 reps  Standing fwd step / backward step x 10 each side without AD and CGA   Gait with HHA + 2 ( without AD ) for 100 feet;  Patient has BLE hip add and crosses LE"S with poor motor control   Hip abd supine x 20 BLE  sidelying hip abd x 20 BLE  Bridging x 20   hooklying marching x 20   Prone knee flex x 20 BLE  Prone hip extension x 20 BLE with fatigue after 8 reps  hooklying ABD/ ER with RTB and 3 sec hold x 20  Patient is able to perform all exercises without rest period and needs cues for correct technique.                            PT Education - 02/04/17 1408    Education provided Yes   Education Details safety with mobility and gait   Person(s) Educated Patient   Methods Explanation;Demonstration   Comprehension Verbalized understanding;Returned demonstration  PT Long Term Goals - 01/26/17 1358      PT LONG TERM GOAL #1   Title Increase BERG score by 7 points to show decreased falls risk with ADLs   Baseline 12/18/16 27/56 01/21/17: 37/56   Time 8   Period Weeks   Status On-going     PT LONG TERM GOAL #2   Title Increase 10 MWT to 1.0 m/s to improve ability to safely ambulate in community   Baseline 12/18/16 .47 m/s 01/21/17: .74 m/s with walker,   Time 8   Period Weeks   Status On-going     PT LONG TERM GOAL #3   Title Patient will be independent in home exercise program to improve strength/mobility for better functional independence with ADLs.   Baseline , Patient is performing HEP    Time 8   Period Weeks   Status On-going     PT LONG TERM GOAL #4   Title Patient will reduce timed up and go to <11 seconds  to reduce fall risk and demonstrate improved transfer/gait ability.   Baseline 12/18/16 not tested due to safety 01/21/17: 20.26 seconds with walker, 8/27 23.43   Time 8   Period Weeks   Status On-going               Plan - 02/04/17 1439    Clinical Impression Statement Patient is educated in correct technique for sit to stand without using the back of his legs to stabilize. He is able to perform step forward and back  wihtout losing his balance and without crossing LE's. He performed ambulation with HHA + 2 for 100 feet with slow gait speed and 2 LE crossing over in adduction. Patient is able to control LE adduction better with slow gait speed. He also performed LE exercises in supine, sidelying and prone without any pain. He will continue to benefit from skilled PT to improve gait , transfers, all mobility and quality of life.    Rehab Potential Good   Clinical Impairments Affecting Rehab Potential Poor balance, weakness in ankle joint, decreased endurance   PT Frequency 2x / week   PT Duration 8 weeks   PT Treatment/Interventions ADLs/Self Care Home Management;Aquatic Therapy;Biofeedback;Electrical Stimulation;Moist Heat;Traction;Contrast Bath;Gait training;Stair training;Functional mobility training;Therapeutic activities;Therapeutic exercise;Balance training;Neuromuscular re-education;Patient/family education;Manual techniques;Passive range of motion;Energy conservation;Splinting;Taping   PT Next Visit Plan Continue strength and ROM therex of upper body and LEs   PT Home Exercise Plan ambulating with walker as much as possible   Consulted and Agree with Plan of Care Patient      Patient will benefit from skilled therapeutic intervention in order to improve the following deficits and impairments:  Abnormal gait, Decreased coordination, Decreased range of motion, Difficulty walking, Decreased endurance, Decreased activity tolerance, Pain, Decreased balance, Hypomobility, Improper body  mechanics, Decreased mobility, Decreased strength  Visit Diagnosis: Muscle weakness (generalized)  Other lack of coordination  Difficulty in walking, not elsewhere classified     Problem List There are no active problems to display for this patient.   [address] [place] DPT 02/04/2017, 2:55 PM  [facility] MAIN [facility] SERVICES [address] [place], [place], [address] Phone: [phone]   Fax:  [phone]  Name: Joshua [name] MRN: [id number] Date of Birth: [birth year]

## 2017-02-04 NOTE — Therapy (Signed)
[facility] MAIN [facility] SERVICES [address] [place], [place], [address] Phone: [phone]   Fax:  [phone]  Occupational Therapy Treatment  Patient Details  Name: Joshua [name] MRN: [id number] Date of Birth: [birth year] No Data Recorded  Encounter Date: 02/04/2017      OT End of Session - 02/04/17 1318    Visit Number 13   Number of Visits 24   Date for OT Re-Evaluation 03/10/17   Authorization Type BCBS   OT Start Time 1300   OT Stop Time 1345   OT Time Calculation (min) 45 min   Activity Tolerance Patient tolerated treatment well   Behavior During Therapy [facility] for tasks assessed/performed      Past Medical History:  Diagnosis Date  . Hypertension   . Scoliosis     No past surgical history on file.  There were no vitals filed for this visit.      Subjective Assessment - 02/04/17 1310    Subjective  Pt. reports less pain overall.   Pertinent History Patient underwent spinal surgery on June 28 at [facility] and was admitted for 2.5 weeks including inpatient rehabilitation.  He did not receive home health and now presents for outpatient.     Patient Stated Goals Patient reports he would like to walk straighter, try to go back to work and also be able to take care of himself.    Currently in Pain? No/denies      OT TREATMENT    Neuro muscular re-education:  Pt. worked on grasping 1" washers and storing them in his hand. Pt. Wworked on translatory movements moving the washers through his left hand, and storing them in the palm of his hand. Pt. Worked on left hand motor control, and coordination needed to reach, and place the washers on a dowel tower.   Therapeutic Exercise:  Pt. performed 2# dowel ex. For UE strengthening secondary to weakness. Bilateral shoulder flexion, chest press, circular patterns, and elbow flexion/extension were performed. 2# dumbbell ex. for elbow flexion and extension, forearm supination/pronation,  wrist flexion/extension, and radial deviation. Pt. requires rest breaks and verbal cues for proper technique.                              OT Education - 02/04/17 1317    Education provided Yes   Education Details UE strength, and coordination.   Person(s) Educated Patient   Methods Explanation   Comprehension Verbalized understanding;Returned demonstration             OT Long Term Goals - 12/20/16 2331      OT LONG TERM GOAL #1   Title Patient will improve B hand function with coordination to be able to tie his shoes independently.    Baseline assist with tying shoes.    Time 12   Period Weeks   Status New     OT LONG TERM GOAL #2   Title Patient will demonstrate donning and doffing shoes with modified independence.    Baseline unable to complete   Time 12   Period Weeks   Status New     OT LONG TERM GOAL #3   Title Patient will complete bathing with modified independence.    Baseline increased assistance at eval   Time 12   Period Weeks   Status New     OT LONG TERM GOAL #4   Title Patient will be independent with home  exercise program   Baseline no current program   Time 8   Period Weeks   Status New     OT LONG TERM GOAL #5   Title Patient will complete light homemaking tasks with modified independence.   Baseline unable   Time 12   Period Weeks   Status New     Long Term Additional Goals   Additional Long Term Goals Yes     OT LONG TERM GOAL #6   Title Patient will improve grip strength in left hand by 5# to open jars and containers with modified independence.    Time 12   Period Weeks   Status New               Plan - 02/04/17 1318    Clinical Impression Statement Pt. reports that he has done dishes at least 4 times over the past week. Pt. reports he did this in standing. Pt. continues to work on improving LUE strength, and left hand fine motor coordination skills for improved engagement in ADLs, and IADLs.    Occupational performance deficits (Please refer to evaluation for details): ADL's;IADL's   Rehab Potential Good   OT Frequency 1x / week   OT Duration 12 weeks   OT Treatment/Interventions Self-care/ADL training;Moist Heat;DME and/or AE instruction;Patient/family education;Therapeutic exercises;Balance training;Therapeutic exercise;Therapeutic activities;Neuromuscular education;Functional Mobility Training;Passive range of motion;Manual Therapy   Consulted and Agree with Plan of Care Patient      Patient will benefit from skilled therapeutic intervention in order to improve the following deficits and impairments:  Decreased coordination, Decreased range of motion, Difficulty walking, Decreased endurance, Decreased activity tolerance, Decreased knowledge of precautions, Decreased balance, Decreased knowledge of use of DME, Impaired UE functional use, Pain, Decreased strength  Visit Diagnosis: Muscle weakness (generalized)  Other lack of coordination    Problem List There are no active problems to display for this patient.   Olegario MessierElaine Phoenyx Melka, MS, OTR/L 02/04/2017, 1:33 PM  [facility] MAIN [facility] [address] [place], [place], [address] Phone: [phone]   Fax:  [phone]  Name: Joshua [name] MRN: [id number] Date of Birth: 05-21-1976

## 2017-02-09 ENCOUNTER — Ambulatory Visit: Payer: BLUE CROSS/BLUE SHIELD | Admitting: Occupational Therapy

## 2017-02-09 ENCOUNTER — Encounter: Payer: Self-pay | Admitting: Physical Therapy

## 2017-02-09 ENCOUNTER — Ambulatory Visit: Payer: BLUE CROSS/BLUE SHIELD | Admitting: Physical Therapy

## 2017-02-09 DIAGNOSIS — R278 Other lack of coordination: Secondary | ICD-10-CM

## 2017-02-09 DIAGNOSIS — M6281 Muscle weakness (generalized): Secondary | ICD-10-CM

## 2017-02-09 DIAGNOSIS — R262 Difficulty in walking, not elsewhere classified: Secondary | ICD-10-CM

## 2017-02-09 DIAGNOSIS — R2681 Unsteadiness on feet: Secondary | ICD-10-CM

## 2017-02-09 NOTE — Therapy (Signed)
[facility] [address] , [place], [address] Phone: [phone]   Fax:  [phone]  Physical Therapy Treatment  Patient Details  Name: Joshua [name] MRN: [id number] Date of Birth: 1975-09-14 Referring Provider: Merrily BrittleAUCH, KIMBERLY KARRAT  Encounter Date: 02/09/2017      PT End of Session - 02/09/17 1356    Visit Number 14   Number of Visits 17   Date for PT Re-Evaluation 02/12/17   Authorization Type BCBS check goals every 30 days. Eval 12/18/16. Re-check 01/21/17   PT Start Time 0148   PT Stop Time 0230   PT Time Calculation (min) 42 min   Equipment Utilized During Treatment Gait belt   Activity Tolerance Patient tolerated treatment well;No increased pain   Behavior During Therapy WFL for tasks assessed/performed      Past Medical History:  Diagnosis Date  . Hypertension   . Scoliosis     History reviewed. No pertinent surgical history.  There were no vitals filed for this visit.      Subjective Assessment - 02/09/17 1354    Subjective Patient states feeling good today and has no new complaints. He reports that he is doing his hEP.   Patient is accompained by: Family member   Pertinent History Patient describes intermitten 3/10 pain in neck before scoliosis surgery. He reports that he was walking without  AD prior to surgery.  Patient had neck surgery in October of 2017, consisting of 13 screws in anterior neck, due to drastic change in balance and coordination.  Scoliosis surgery performed 11/27/16 with fusion of 13 vertebrae.  Patient denies dizziness, headaches, visual defecits, numbness, and tingling. Patient has 2 drains from abdomen.    Limitations Standing;Walking;Lifting   How long can you sit comfortably? 10 minutes   How long can you stand comfortably? 20 minutes   How long can you walk comfortably? 10 minutes   Patient Stated Goals Walk straight and be independent with ambulation   Currently in  Pain? No/denies   Pain Score 0-No pain   Pain Onset More than a month ago   Multiple Pain Sites No        Treatment:    Standing fwd step / backward step x 10 each side without AD and CGA   TM x 4. 5 mins at . 5 miles / hour with ataxia in BLE and hip adduction with patient able to partially correct , needs cues for heel toe gait.   sidelying hip abd x 20 x 2 BLE  Bridging, with SLR R and SLR  L  x 20 x 2  hooklying marching x 20 x 2  Prone knee flex x 20 BLE  Prone hip extension x 20 BLE with fatigue   hooklying ABD/ ER with RTB and 3 sec hold x 20  Leg press x 20  X 3 with 100 lbs   Heel raises x 20 x 2 , cues to not lean fwd  Patient is able to perform all exercises without rest period and needs cues for correct technique.                          PT Education - 02/09/17 1355    Education provided Yes   Education Details Progression of HEP   Person(s) Educated Patient   Methods Explanation;Demonstration   Comprehension Verbalized understanding;Returned demonstration             PT  Long Term Goals - 01/26/17 1358      PT LONG TERM GOAL #1   Title Increase BERG score by 7 points to show decreased falls risk with ADLs   Baseline 12/18/16 27/56 01/21/17: 37/56   Time 8   Period Weeks   Status On-going     PT LONG TERM GOAL #2   Title Increase 10 MWT to 1.0 m/s to improve ability to safely ambulate in community   Baseline 12/18/16 .47 m/s 01/21/17: .74 m/s with walker,   Time 8   Period Weeks   Status On-going     PT LONG TERM GOAL #3   Title Patient will be independent in home exercise program to improve strength/mobility for better functional independence with ADLs.   Baseline , Patient is performing HEP    Time 8   Period Weeks   Status On-going     PT LONG TERM GOAL #4   Title Patient will reduce timed up and go to <11 seconds to reduce fall risk and demonstrate improved transfer/gait ability.   Baseline 12/18/16 not  tested due to safety 01/21/17: 20.26 seconds with walker, 8/27 23.43   Time 8   Period Weeks   Status On-going               Plan - 02/09/17 1356    Clinical Impression Statement Patient   instructed in TM on level surfaces  with rest periods standing. Patient is able to perform beginning LE exercises with pain behaviors and resting  throughout session. Patient will continue to benefit from skilled PT to improve strength, balance , transfer safety and gait. Patient  continues to have LE weakness   and decreased static and dynamic standing balance.   Clinical Impairments Affecting Rehab Potential Poor balance, weakness in ankle joint, decreased endurance   PT Frequency 2x / week   PT Duration 8 weeks   PT Treatment/Interventions ADLs/Self Care Home Management;Aquatic Therapy;Biofeedback;Electrical Stimulation;Moist Heat;Traction;Contrast Bath;Gait training;Stair training;Functional mobility training;Therapeutic activities;Therapeutic exercise;Balance training;Neuromuscular re-education;Patient/family education;Manual techniques;Passive range of motion;Energy conservation;Splinting;Taping   PT Next Visit Plan Continue strength and ROM therex of upper body and LEs   PT Home Exercise Plan ambulating with walker as much as possible   Consulted and Agree with Plan of Care Patient      Patient will benefit from skilled therapeutic intervention in order to improve the following deficits and impairments:  Abnormal gait, Decreased coordination, Decreased range of motion, Difficulty walking, Decreased endurance, Decreased activity tolerance, Pain, Decreased balance, Hypomobility, Improper body mechanics, Decreased mobility, Decreased strength  Visit Diagnosis: Muscle weakness (generalized)  Other lack of coordination  Difficulty in walking, not elsewhere classified  Unsteadiness on feet     Problem List There are no active problems to display for this patient.   [address]  [place] DPT 02/09/2017, 2:01 PM  [facility] MAIN [facility] SERVICES [address] [place], [place], [address] Phone: [phone]   Fax:  [phone]  Name: Joshua [name] MRN: [id number] Date of Birth: [birth year]

## 2017-02-09 NOTE — Therapy (Signed)
[facility] MAIN [facility] SERVICES [address] [place], [place], [address] Phone: [phone]   Fax:  [phone]  Occupational Therapy Treatment  Patient Details  Name: Joshua [name] MRN: [id number] Date of Birth: 1976-04-20 No Data Recorded  Encounter Date: 02/09/2017      OT End of Session - 02/09/17 1307    Visit Number 14   Number of Visits 24   Date for OT Re-Evaluation 03/10/17   Authorization Type BCBS   OT Start Time 1300   OT Stop Time 1345   OT Time Calculation (min) 45 min   Activity Tolerance Patient tolerated treatment well   Behavior During Therapy [facility] for tasks assessed/performed      Past Medical History:  Diagnosis Date  . Hypertension   . Scoliosis     No past surgical history on file.  There were no vitals filed for this visit.      Subjective Assessment - 02/09/17 1304    Subjective  Pt. reports he did not do anything this past weekend.   Pertinent History Patient underwent spinal surgery on June 28 at [facility] and was admitted for 2.5 weeks including inpatient rehabilitation.  He did not receive home health and now presents for outpatient.     Patient Stated Goals Patient reports he would like to walk straighter, try to go back to work and also be able to take care of himself.    Currently in Pain? Yes   Pain Score 1    Pain Location Arm   Pain Orientation Left      OT TREATMENT    Neuro muscular re-education:  Pt. performed [facility] skills training to improve speed and dexterity needed for ADL tasks and writing. Pt. demonstrated grasping 1 inch sticks,  inch cylindrical collars, and  inch flat washers on the Purdue pegboard. Pt. performed grasping each item with her 2nd digit and thumb, and storing them in the palm. Pt. presented with difficulty storing  inch objects at a time in the palmar aspect of the hand.  Therapeutic Exercise:  Pt. performed 2# dowel ex. For UE strengthening secondary to  weakness. Bilateral shoulder flexion, chest press, circular patterns, horizontal V patterns, and elbow flexion/extension were performed. 3# dumbbell ex. for elbow flexion and extension, forearm supination/pronation, wrist flexion/extension, and radial deviation. Pt. requires rest breaks and verbal cues for proper technique.                              OT Education - 02/09/17 1306    Education provided Yes   Education Details UE functioning   Person(s) Educated Patient   Methods Demonstration;Explanation   Comprehension Verbalized understanding;Returned demonstration             OT Long Term Goals - 12/20/16 2331      OT LONG TERM GOAL #1   Title Patient will improve B hand function with coordination to be able to tie his shoes independently.    Baseline assist with tying shoes.    Time 12   Period Weeks   Status New     OT LONG TERM GOAL #2   Title Patient will demonstrate donning and doffing shoes with modified independence.    Baseline unable to complete   Time 12   Period Weeks   Status New     OT LONG TERM GOAL #3   Title Patient will complete bathing with modified independence.  Baseline increased assistance at eval   Time 12   Period Weeks   Status New     OT LONG TERM GOAL #4   Title Patient will be independent with home exercise program   Baseline no current program   Time 8   Period Weeks   Status New     OT LONG TERM GOAL #5   Title Patient will complete light homemaking tasks with modified independence.   Baseline unable   Time 12   Period Weeks   Status New     Long Term Additional Goals   Additional Long Term Goals Yes     OT LONG TERM GOAL #6   Title Patient will improve grip strength in left hand by 5# to open jars and containers with modified independence.    Time 12   Period Weeks   Status New               Plan - 02/09/17 1311    Clinical Impression Statement Pt. reports he is doing dishes at home,  and was able to do some light meal preparation this past weekend. Pt. continues to work on improving UE strength, and coordination skills for improved engagement in ADLs, and IADLs.    Occupational performance deficits (Please refer to evaluation for details): ADL's;IADL's   Rehab Potential Good   OT Frequency 1x / week   OT Duration 12 weeks   OT Treatment/Interventions Self-care/ADL training;Moist Heat;DME and/or AE instruction;Patient/family education;Therapeutic exercises;Balance training;Therapeutic exercise;Therapeutic activities;Neuromuscular education;Functional Mobility Training;Passive range of motion;Manual Therapy   Consulted and Agree with Plan of Care Patient      Patient will benefit from skilled therapeutic intervention in order to improve the following deficits and impairments:  Decreased coordination, Decreased range of motion, Difficulty walking, Decreased endurance, Decreased activity tolerance, Decreased knowledge of precautions, Decreased balance, Decreased knowledge of use of DME, Impaired UE functional use, Pain, Decreased strength  Visit Diagnosis: Muscle weakness (generalized)    Problem List There are no active problems to display for this patient.   Olegario MessierElaine Johnchristopher Sarvis, MS ,OTR/L 02/09/2017, 1:28 PM  [place] [facility] [address] [place], [place], [address] Phone: [phone]   Fax:  [phone]  Name: Joshua [name] MRN: [id number] Date of Birth: 1976-03-14

## 2017-02-11 ENCOUNTER — Encounter: Payer: Self-pay | Admitting: Physical Therapy

## 2017-02-11 ENCOUNTER — Ambulatory Visit: Payer: BLUE CROSS/BLUE SHIELD | Admitting: Occupational Therapy

## 2017-02-11 ENCOUNTER — Ambulatory Visit: Payer: BLUE CROSS/BLUE SHIELD | Admitting: Physical Therapy

## 2017-02-11 DIAGNOSIS — R262 Difficulty in walking, not elsewhere classified: Secondary | ICD-10-CM

## 2017-02-11 DIAGNOSIS — R278 Other lack of coordination: Secondary | ICD-10-CM

## 2017-02-11 DIAGNOSIS — R2681 Unsteadiness on feet: Secondary | ICD-10-CM

## 2017-02-11 DIAGNOSIS — M6281 Muscle weakness (generalized): Secondary | ICD-10-CM | POA: Diagnosis not present
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
## 2017-02-11 NOTE — Therapy (Addendum)
[place] [facility] MAIN [facility] [address] [place], [place], [address] Phone: [phone]   Fax:  [phone]  Physical Therapy Treatment/ re certification  Patient Details  Name: Joshua HotterJames [name] MRN: [id number] Date of Birth: 1976-02-16 Referring Provider: Merrily BrittleAUCH, KIMBERLY KARRAT  Encounter Date: 02/11/2017      PT End of Session - 02/11/17 1352    Visit Number 15   Number of Visits 33   Date for PT Re-Evaluation 04/08/17   Authorization Type BCBS check goals every 30 days. Eval 12/18/16. Re-check 01/21/17   PT Start Time 0146   PT Stop Time 0230   PT Time Calculation (min) 44 min   Equipment Utilized During Treatment Gait belt   Activity Tolerance Patient tolerated treatment well;No increased pain   Behavior During Therapy WFL for tasks assessed/performed      Past Medical History:  Diagnosis Date  . Hypertension   . Scoliosis     History reviewed. No pertinent surgical history.  There were no vitals filed for this visit.      Subjective Assessment - 02/11/17 1351    Subjective Patient states feeling good today and has no new complaints. He reports that he is doing his hEP. He is walking at home with his RW.    Patient is accompained by: Family member   Pertinent History Patient describes intermitten 3/10 pain in neck before scoliosis surgery. He reports that he was walking without  AD prior to surgery.  Patient had neck surgery in October of 2017, consisting of 13 screws in anterior neck, due to drastic change in balance and coordination.  Scoliosis surgery performed 11/27/16 with fusion of 13 vertebrae.  Patient denies dizziness, headaches, visual defecits, numbness, and tingling. Patient has 2 drains from abdomen.    Limitations Standing;Walking;Lifting   How long can you sit comfortably? 10 minutes   How long can you stand comfortably? 20 minutes   How long can you walk comfortably? 10 minutes   Patient Stated Goals Walk straight  and be independent with ambulation   Currently in Pain? No/denies   Pain Score 0-No pain   Pain Onset More than a month ago   Multiple Pain Sites No      Treatment:  Gait in parallel bars with 2 x 4 down the middle to assist with LE's motor control and not adduct his LE's x 10 feet x 5 laps  Ambulation in parallel bars concentrating on not adducting his LE's with a slow gait speed and UE support  Standing with 3 lb dowell rod and fwd flex, horizontal flex x10 each  Standing on purple mat feet apart and feet together with Standing with 3 lb dowell rod and fwd flex, horizontal flex x10 each, loss of balance posteriorly  Standing and reaching across midline to cards on mirror x 10  BLE  Standing on purple foam and yellow theraball with trunk rotation and arms extended x 10 feet apart and feet together, loss of balance posteriorly   Side stepping on bosu ball x 10 with lunge BLE,, decreased coordination throughout movement  Fwd stepping on bosu ball x 10 with lunge BLE , decreased coordination throughout movement  Squats x 10 x 2 with LE ataxia during squat  Heel raises x 10 x 2 with decreasing heel height after 5 reps    Patient has decreased motor control and ataxia in LE's and trunk with closed chain exercises. HEP was reviewed and progressed for safety  PT Education - 02/11/17 1351    Education provided Yes   Education Details safety with leg position during ambulation   Person(s) Educated Patient   Methods Explanation;Demonstration   Comprehension Verbalized understanding;Returned demonstration             PT Long Term Goals - 01/26/17 1358      PT LONG TERM GOAL #1   Title Increase BERG score by 7 points to show decreased falls risk with ADLs   Baseline 12/18/16 27/56 01/21/17: 37/56   Time 8   Period Weeks   Status On-going     PT LONG TERM GOAL #2   Title Increase 10 MWT to 1.0 m/s to improve ability to safely  ambulate in community   Baseline 12/18/16 .47 m/s 01/21/17: .74 m/s with walker,   Time 8   Period Weeks   Status On-going     PT LONG TERM GOAL #3   Title Patient will be independent in home exercise program to improve strength/mobility for better functional independence with ADLs.   Baseline , Patient is performing HEP    Time 8   Period Weeks   Status On-going     PT LONG TERM GOAL #4   Title Patient will reduce timed up and go to <11 seconds to reduce fall risk and demonstrate improved transfer/gait ability.   Baseline 12/18/16 not tested due to safety 01/21/17: 20.26 seconds with walker, 8/27 23.43   Time 8   Period Weeks   Status On-going               Plan - 02/11/17 1352    Clinical Impression Statement Patient was instructed in LE exercise and balance training for dynamic and static standing balance. He ambulates with RW on level surfaces with  frequent LE crossing midline and decreased BLE DF.  He transfers with use of UE for safety and uses the back of LE's to stabilize trunk during transfer. He will continue to benefit from skillec PT to improve safety and mobility as well as strength of core and LE's.   Rehab Potential Good   Clinical Impairments Affecting Rehab Potential Poor balance, weakness in ankle joint, decreased endurance   PT Frequency 2x / week   PT Duration 8 weeks   PT Treatment/Interventions ADLs/Self Care Home Management;Aquatic Therapy;Biofeedback;Electrical Stimulation;Moist Heat;Traction;Contrast Bath;Gait training;Stair training;Functional mobility training;Therapeutic activities;Therapeutic exercise;Balance training;Neuromuscular re-education;Patient/family education;Manual techniques;Passive range of motion;Energy conservation;Splinting;Taping   PT Next Visit Plan Continue strength and ROM therex of upper body and LEs   PT Home Exercise Plan ambulating with walker as much as possible   Consulted and Agree with Plan of Care Patient      Patient  will benefit from skilled therapeutic intervention in order to improve the following deficits and impairments:  Abnormal gait, Decreased coordination, Decreased range of motion, Difficulty walking, Decreased endurance, Decreased activity tolerance, Pain, Decreased balance, Hypomobility, Improper body mechanics, Decreased mobility, Decreased strength  Visit Diagnosis: Muscle weakness (generalized) - Plan: PT plan of care cert/re-cert  Other lack of coordination - Plan: PT plan of care cert/re-cert  Difficulty in walking, not elsewhere classified - Plan: PT plan of care cert/re-cert  Unsteadiness on feet - Plan: PT plan of care cert/re-cert     Problem List There are no active problems to display for this patient.   [address] [place], [place] DPT 02/11/2017, 4:09 PM  [place] [facility] MAIN [facility] SERVICES [address] [place], [place], [address] Phone: [phone]  Fax:  [phone]  Name: Quentin Shorey MRN: [id number] Date of Birth: 10-15-1975

## 2017-02-16 ENCOUNTER — Ambulatory Visit: Payer: BLUE CROSS/BLUE SHIELD | Admitting: Occupational Therapy

## 2017-02-16 ENCOUNTER — Encounter: Payer: Self-pay | Admitting: Physical Therapy

## 2017-02-16 ENCOUNTER — Ambulatory Visit: Payer: BLUE CROSS/BLUE SHIELD | Admitting: Physical Therapy

## 2017-02-16 DIAGNOSIS — R262 Difficulty in walking, not elsewhere classified: Secondary | ICD-10-CM

## 2017-02-16 DIAGNOSIS — M6281 Muscle weakness (generalized): Secondary | ICD-10-CM

## 2017-02-16 DIAGNOSIS — R2681 Unsteadiness on feet: Secondary | ICD-10-CM

## 2017-02-16 DIAGNOSIS — R278 Other lack of coordination: Secondary | ICD-10-CM

## 2017-02-16 NOTE — Therapy (Signed)
[place] MAIN [facility] SERVICES [address] [place], [place], [address] Phone: [phone]   Fax:  [phone]  Physical Therapy Treatment  Patient Details  Name: Joshua [name] MRN: [id number] Date of Birth: 1976-02-07 Referring Provider: Merrily Brittle  Encounter Date: 02/16/2017      PT End of Session - 02/16/17 1351    Visit Number 16   Number of Visits 33   Date for PT Re-Evaluation 04/08/17   Authorization Type BCBS check goals every 30 days. Eval 12/18/16. Re-check 01/21/17   PT Start Time 0146   PT Stop Time 0225   PT Time Calculation (min) 39 min   Equipment Utilized During Treatment Gait belt   Activity Tolerance Patient tolerated treatment well;No increased pain   Behavior During Therapy WFL for tasks assessed/performed      Past Medical History:  Diagnosis Date  . Hypertension   . Scoliosis     History reviewed. No pertinent surgical history.  There were no vitals filed for this visit.      Subjective Assessment - 02/16/17 1350    Subjective Pt states that he feels good today and has no new complaints to note.  He states that he has been walking at home focusing on heels first and avoiding letting his legs cross.   Pertinent History Patient describes intermitten 3/10 pain in neck before scoliosis surgery. He reports that he was walking without  AD prior to surgery.  Patient had neck surgery in October of 2017, consisting of 13 screws in anterior neck, due to drastic change in balance and coordination.  Scoliosis surgery performed 11/27/16 with fusion of 13 vertebrae.  Patient denies dizziness, headaches, visual defecits, numbness, and tingling. Patient has 2 drains from abdomen.    Limitations Standing;Walking;Lifting   How long can you sit comfortably? 10 minutes   How long can you stand comfortably? 20 minutes   How long can you walk comfortably? 10 minutes   Patient Stated Goals Walk straight and be independent  with ambulation   Currently in Pain? No/denies   Pain Onset More than a month ago      Treatment:  Treadmill (warm up) at 0.5 mph x 5 mins w/ cues for heel strike and proper gait pattern to avoid LE scissoring; improved pattern after warm up  Sidelying clamshells with red theraband x 20 B LE's - cues for slow eccentric   Sidelying hip ABD x 20 B LE's - cues for proper form; decreased ROM towards end of set due to increased gluteal fatigue  Supine SLR x 20 B LE's   Prone hip ext w/knee bent to target glute max x 10 B LE's  - demonstration for technique; increased fatigue and decreased ROM after 5 reps  Prone hip ext with knee extended x 10 B LE's  Sit to stands (slow) x 10 reps from mat table - cues to lean forward to reduce retropulsion   Heel raises x 20 - reduced height after 15 reps due to muscle fatigue  Toe Taps to 6 in. Step x 20 reps B LE's - cues for form and technique  Balance on foam pad with 2# rod - cues for technique and form, cues to use abdominals to maintain balance while moving arms  Wide stance with shoulder flexion 2 x 10 Wide stance with chest press 2 x 10  Narrow stance with shoulder flexion 2 x 10 Narrow stance with chest press 2 x 10   Patient requires  cues for proper ambulation pattern to improve heel strike and reduce LE scissoring.                   PT Education - 02/16/17 1350    Education provided Yes   Education Details Continue HEP; proper gait pattern to avoid scissoring of LE's with gait             PT Long Term Goals - 01/26/17 1358      PT LONG TERM GOAL #1   Title Increase BERG score by 7 points to show decreased falls risk with ADLs   Baseline 12/18/16 27/56 01/21/17: 37/56   Time 8   Period Weeks   Status On-going     PT LONG TERM GOAL #2   Title Increase 10 MWT to 1.0 m/s to improve ability to safely ambulate in community   Baseline 12/18/16 .47 m/s 01/21/17: .74 m/s with walker,   Time 8   Period Weeks    Status On-going     PT LONG TERM GOAL #3   Title Patient will be independent in home exercise program to improve strength/mobility for better functional independence with ADLs.   Baseline , Patient is performing HEP    Time 8   Period Weeks   Status On-going     PT LONG TERM GOAL #4   Title Patient will reduce timed up and go to <11 seconds to reduce fall risk and demonstrate improved transfer/gait ability.   Baseline 12/18/16 not tested due to safety 01/21/17: 20.26 seconds with walker, 8/27 23.43   Time 8   Period Weeks   Status On-going               Plan - 02/16/17 1426    Clinical Impression Statement Pt continues to progress well through static and dynamic balance training, gait training and strengthening activities.  pt able to perform all strength exercises today without an increase in pain, however pt has increased muscle fatigue secondary to weakness.  Pt was able to progress dynamic balance activities today, demonstrating decreased loss of balance while moving outside base of support.  Pt shows improvement with gait, however LE scissoring and decreased heel strike still present especially with fatigue and when patient is not focusing on proper pattern.  Pt would continue to benefit from skilled therapy services to address strength, balance and gait deficits.   Rehab Potential Good   Clinical Impairments Affecting Rehab Potential Poor balance, weakness in ankle joint, decreased endurance   PT Frequency 2x / week   PT Duration 8 weeks   PT Treatment/Interventions ADLs/Self Care Home Management;Aquatic Therapy;Biofeedback;Electrical Stimulation;Moist Heat;Traction;Contrast Bath;Gait training;Stair training;Functional mobility training;Therapeutic activities;Therapeutic exercise;Balance training;Neuromuscular re-education;Patient/family education;Manual techniques;Passive range of motion;Energy conservation;Splinting;Taping   PT Next Visit Plan Continue strength and ROM therex of  upper body and LEs   PT Home Exercise Plan ambulating with walker as much as possible; sidelying clams and hip ABD   Consulted and Agree with Plan of Care Patient      Patient will benefit from skilled therapeutic intervention in order to improve the following deficits and impairments:  Abnormal gait, Decreased coordination, Decreased range of motion, Difficulty walking, Decreased endurance, Decreased activity tolerance, Pain, Decreased balance, Hypomobility, Improper body mechanics, Decreased mobility, Decreased strength  Visit Diagnosis: Muscle weakness (generalized)  Other lack of coordination  Difficulty in walking, not elsewhere classified  Unsteadiness on feet     Problem List There are no active problems to display for this patient.  This entire session was performed under direct supervision and direction of a licensed therapist/therapist assistant . I have personally read, edited and approve of the note as written. Stacey Drain, SPT Ezekiel Ina, PT, DPT  02/16/2017, 2:34 PM  [facility] MAIN [facility] SERVICES [address] [place], [place], [address] Phone: [phone]   Fax:  [phone]  Name: Joshua [name] MRN: [id number] Date of Birth: Apr 28, 1976
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
## 2017-02-18 ENCOUNTER — Ambulatory Visit: Payer: BLUE CROSS/BLUE SHIELD | Admitting: Physical Therapy

## 2017-02-18 ENCOUNTER — Ambulatory Visit: Payer: BLUE CROSS/BLUE SHIELD | Admitting: Occupational Therapy

## 2017-02-23 ENCOUNTER — Ambulatory Visit: Payer: BLUE CROSS/BLUE SHIELD | Admitting: Physical Therapy

## 2017-02-23 ENCOUNTER — Ambulatory Visit: Payer: BLUE CROSS/BLUE SHIELD | Admitting: Occupational Therapy

## 2017-02-25 ENCOUNTER — Ambulatory Visit: Payer: BLUE CROSS/BLUE SHIELD | Admitting: Occupational Therapy

## 2017-02-25 ENCOUNTER — Ambulatory Visit: Payer: BLUE CROSS/BLUE SHIELD | Admitting: Physical Therapy

## 2017-03-02 ENCOUNTER — Ambulatory Visit: Payer: BLUE CROSS/BLUE SHIELD | Admitting: Occupational Therapy

## 2017-03-02 ENCOUNTER — Ambulatory Visit: Payer: BLUE CROSS/BLUE SHIELD | Admitting: Physical Therapy

## 2017-03-04 ENCOUNTER — Ambulatory Visit: Payer: BLUE CROSS/BLUE SHIELD | Admitting: Physical Therapy

## 2017-03-04 ENCOUNTER — Ambulatory Visit: Payer: BLUE CROSS/BLUE SHIELD | Admitting: Occupational Therapy

## 2017-03-09 ENCOUNTER — Encounter: Payer: BLUE CROSS/BLUE SHIELD | Admitting: Occupational Therapy

## 2017-03-09 ENCOUNTER — Ambulatory Visit: Payer: BLUE CROSS/BLUE SHIELD | Admitting: Physical Therapy

## 2017-03-11 ENCOUNTER — Encounter: Payer: BLUE CROSS/BLUE SHIELD | Admitting: Occupational Therapy

## 2017-03-11 ENCOUNTER — Encounter: Payer: BLUE CROSS/BLUE SHIELD | Admitting: Physical Therapy

## 2017-03-16 ENCOUNTER — Ambulatory Visit: Payer: BLUE CROSS/BLUE SHIELD | Admitting: Occupational Therapy

## 2017-03-18 ENCOUNTER — Encounter: Payer: BLUE CROSS/BLUE SHIELD | Admitting: Physical Therapy

## 2017-03-23 ENCOUNTER — Encounter: Payer: BLUE CROSS/BLUE SHIELD | Admitting: Physical Therapy

## 2017-03-23 ENCOUNTER — Encounter: Payer: BLUE CROSS/BLUE SHIELD | Admitting: Occupational Therapy

## 2017-03-25 ENCOUNTER — Encounter: Payer: BLUE CROSS/BLUE SHIELD | Admitting: Occupational Therapy

## 2017-03-25 ENCOUNTER — Encounter: Payer: BLUE CROSS/BLUE SHIELD | Admitting: Physical Therapy

## 2017-03-30 ENCOUNTER — Encounter: Payer: BLUE CROSS/BLUE SHIELD | Admitting: Occupational Therapy

## 2017-03-30 ENCOUNTER — Encounter: Payer: BLUE CROSS/BLUE SHIELD | Admitting: Physical Therapy

## 2017-04-01 ENCOUNTER — Encounter: Payer: BLUE CROSS/BLUE SHIELD | Admitting: Occupational Therapy

## 2017-04-01 ENCOUNTER — Encounter: Payer: BLUE CROSS/BLUE SHIELD | Admitting: Physical Therapy

## 2020-06-11 ENCOUNTER — Emergency Department: Payer: Medicare HMO

## 2020-06-11 ENCOUNTER — Other Ambulatory Visit: Payer: Self-pay

## 2020-06-11 ENCOUNTER — Encounter: Payer: Self-pay | Admitting: Emergency Medicine

## 2020-06-11 ENCOUNTER — Emergency Department
Admission: EM | Admit: 2020-06-11 | Discharge: 2020-06-11 | Disposition: A | Discharge status: Home / Self Care | Payer: Medicare HMO | Attending: Emergency Medicine | Admitting: Emergency Medicine

## 2020-06-11 DIAGNOSIS — I10 Essential (primary) hypertension: Secondary | ICD-10-CM | POA: Insufficient documentation

## 2020-06-11 DIAGNOSIS — M65841 Other synovitis and tenosynovitis, right hand: Secondary | ICD-10-CM | POA: Diagnosis not present

## 2020-06-11 DIAGNOSIS — M79641 Pain in right hand: Secondary | ICD-10-CM | POA: Diagnosis present

## 2020-06-11 DIAGNOSIS — Z79899 Other long term (current) drug therapy: Secondary | ICD-10-CM | POA: Diagnosis not present

## 2020-06-11 DIAGNOSIS — M778 Other enthesopathies, not elsewhere classified: Secondary | ICD-10-CM

## 2020-06-11 MED ORDER — MELOXICAM 15 MG PO TABS: 15.0000 mg | ORAL_TABLET | Freq: Every day | ORAL | 0 refills | Status: AC

## 2020-06-11 NOTE — Discharge Instructions (Addendum)
Your x-ray is normal. Your symptoms are most likely related to tendonitis.  Use ice or heat on your hand to help with pain.  Also, take the medication prescribed. Follow up with primary care if not improving over the week.  Return to the ER for symptoms that change or worsen if unable to schedule an appointment.

## 2020-06-11 NOTE — ED Provider Notes (Signed)
[facility] Emergency Department Provider Note ____________________________________________  Time seen: Approximately 12:09 PM  I have reviewed the triage vital signs and the nursing notes.   HISTORY  Chief Complaint Wrist Pain    HPI Joshua [name] is a 45 y.o. male who presents to the emergency department for evaluation and treatment of nontraumatic right hand pain.  Pain started last week.  No alleviating measures attempted prior to arrival.   Past Medical History:  Diagnosis Date  . Hypertension   . Scoliosis     There are no problems to display for this patient.   History reviewed. No pertinent surgical history.  Prior to Admission medications   Medication Sig Start Date End Date Taking? Authorizing Provider  meloxicam (MOBIC) 15 MG tablet Take 1 tablet (15 mg total) by mouth daily. 06/11/20  Yes Angles Trevizo B, FNP  cyclobenzaprine (FLEXERIL) 10 MG tablet Take 1 tablet (10 mg total) by mouth every 8 (eight) hours as needed for muscle spasms. 02/15/15   Joni Reining, PA-C  metoprolol (LOPRESSOR) 50 MG tablet Take 50 mg by mouth 2 (two) times daily.    [provider]  metoprolol (LOPRESSOR) 50 MG tablet Take 50 mg by mouth daily.    [provider]    Allergies Patient has no known allergies.  No family history on file.  Social History Social History   Tobacco Use  . Smoking status: Never Smoker  . Smokeless tobacco: Never Used  Substance Use Topics  . Alcohol use: Yes  . Drug use: No    Review of Systems Constitutional: Negative for fever. Cardiovascular: Negative for chest pain. Respiratory: Negative for shortness of breath. Musculoskeletal: Positive for right thumb pain Skin: Negative for open wounds or lesions. Neurological: Negative for decrease in sensation  ____________________________________________   PHYSICAL EXAM:  VITAL SIGNS: ED Triage Vitals  Enc Vitals Group     BP 06/11/20 1119 (!)  162/87     Pulse Rate 06/11/20 1119 86     Resp 06/11/20 1119 18     Temp 06/11/20 1119 98.8 F (37.1 C)     Temp Source 06/11/20 1119 Oral     SpO2 06/11/20 1119 95 %     Weight 06/11/20 1022 145 lb 1 oz (65.8 kg)     Height 06/11/20 1022 5\' 9"  (1.753 m)     Head Circumference --      Peak Flow --      Pain Score 06/11/20 1022 6     Pain Loc --      Pain Edu? --      Excl. in GC? --     Constitutional: Alert and oriented. Well appearing and in no acute distress. Eyes: Conjunctivae are clear without discharge or drainage Head: Atraumatic Neck: Supple. Respiratory: No cough. Respirations are even and unlabored. Musculoskeletal: Full range of motion of the fingers of the right hand and of the wrist.  No swelling.  Patient has tenderness on flexion and extension against resistance. Neurologic: Motor and sensory function is intact. Skin: No open wounds or lesions Psychiatric: Affect and behavior are appropriate.  ____________________________________________   LABS (all labs ordered are listed, but only abnormal results are displayed)  Labs Reviewed - No data to display ____________________________________________  RADIOLOGY  Image of the right hand is negative for acute findings per radiology.  I, 08/09/20, personally viewed and evaluated these images (plain radiographs) as part of my medical decision making, as well as reviewing the written  report by the radiologist.  DG Wrist Complete Right  Result Date: 06/11/2020 CLINICAL DATA:  RIGHT hand and wrist pain since last week, no known injury EXAM: RIGHT WRIST - COMPLETE 3+ VIEW COMPARISON:  None FINDINGS: Osseous mineralization normal. Joint spaces preserved. No fracture, dislocation, or bone destruction. IMPRESSION: Normal exam. Electronically Signed   By: [name] M.D.   On: 06/11/2020 13:04    ____________________________________________   PROCEDURES  Procedures  ____________________________________________   INITIAL IMPRESSION / ASSESSMENT AND PLAN / ED COURSE  Joshua [name] is a 45 y.o. who presents to the emergency department for treatment and evaluation of right hand pain which was nontraumatic and started approximately 1 week ago.  Symptoms and exam are Most consistent with tendinitis.  X-ray is negative for acute bony abnormality.  He will be treated with meloxicam and encouraged to follow-up with primary care if symptoms do not improve over the week.  Medications - No data to display  Pertinent labs & imaging results that were available during my care of the patient were reviewed by me and considered in my medical decision making (see chart for details).   _________________________________________   FINAL CLINICAL IMPRESSION(S) / ED DIAGNOSES  Final diagnoses:  Hand pain, right  Thumb tendonitis    ED Discharge Orders         Ordered    meloxicam (MOBIC) 15 MG tablet  Daily        06/11/20 1313           If controlled substance prescribed during this visit, 12 month history viewed on the [facility] prior to issuing an initial prescription for Schedule II or III opiod.   [name], FNP 06/11/20 1335    [name], MD 06/11/20 1352

## 2020-06-11 NOTE — ED Triage Notes (Signed)
C/O right wrist and hand pain since last week.  Denies injury.  AAOx3.  Skin warm and dry.  Ambulates with a walker independently.

## 2020-06-11 NOTE — ED Notes (Signed)
Follow up pcp , rx for pain on hand all info discussed all questions answered

## 2023-04-18 ENCOUNTER — Other Ambulatory Visit (HOSPITAL_BASED_OUTPATIENT_CLINIC_OR_DEPARTMENT_OTHER): Payer: Self-pay
